# Patient Record
Sex: Male | Born: 1955 | Hispanic: No | Marital: Married | Smoking: Never smoker
Health system: Southern US, Community
[De-identification: ages and names within clinical notes are randomized; demographics above are authoritative.]

## PROBLEM LIST (undated history)

## (undated) DIAGNOSIS — G4730 Sleep apnea, unspecified: Secondary | ICD-10-CM

## (undated) DIAGNOSIS — R972 Elevated prostate specific antigen [PSA]: Secondary | ICD-10-CM

## (undated) DIAGNOSIS — Z1589 Genetic susceptibility to other disease: Secondary | ICD-10-CM

## (undated) DIAGNOSIS — G4700 Insomnia, unspecified: Secondary | ICD-10-CM

## (undated) DIAGNOSIS — E232 Diabetes insipidus: Secondary | ICD-10-CM

## (undated) DIAGNOSIS — E785 Hyperlipidemia, unspecified: Secondary | ICD-10-CM

## (undated) DIAGNOSIS — K219 Gastro-esophageal reflux disease without esophagitis: Secondary | ICD-10-CM

## (undated) DIAGNOSIS — R5383 Other fatigue: Secondary | ICD-10-CM

## (undated) DIAGNOSIS — J45909 Unspecified asthma, uncomplicated: Secondary | ICD-10-CM

## (undated) DIAGNOSIS — G930 Cerebral cysts: Secondary | ICD-10-CM

## (undated) DIAGNOSIS — K648 Other hemorrhoids: Secondary | ICD-10-CM

## (undated) DIAGNOSIS — Z860101 Personal history of adenomatous and serrated colon polyps: Secondary | ICD-10-CM

## (undated) DIAGNOSIS — M51369 Other intervertebral disc degeneration, lumbar region without mention of lumbar back pain or lower extremity pain: Secondary | ICD-10-CM

## (undated) DIAGNOSIS — K5190 Ulcerative colitis, unspecified, without complications: Secondary | ICD-10-CM

## (undated) DIAGNOSIS — J328 Other chronic sinusitis: Secondary | ICD-10-CM

## (undated) DIAGNOSIS — I4891 Unspecified atrial fibrillation: Secondary | ICD-10-CM

## (undated) DIAGNOSIS — I2510 Atherosclerotic heart disease of native coronary artery without angina pectoris: Secondary | ICD-10-CM

## (undated) DIAGNOSIS — E7800 Pure hypercholesterolemia, unspecified: Secondary | ICD-10-CM

## (undated) DIAGNOSIS — R0683 Snoring: Secondary | ICD-10-CM

## (undated) DIAGNOSIS — Z86010 Personal history of colonic polyps: Secondary | ICD-10-CM

## (undated) HISTORY — DX: Personal history of adenomatous and serrated colon polyps: Z86.0101

## (undated) HISTORY — DX: Sleep apnea, unspecified: G47.30

## (undated) HISTORY — DX: Ulcerative colitis, unspecified, without complications: K51.90

## (undated) HISTORY — DX: Unspecified asthma, uncomplicated: J45.909

## (undated) HISTORY — DX: Other hemorrhoids: K64.8

## (undated) HISTORY — DX: Hyperlipidemia, unspecified: E78.5

## (undated) HISTORY — DX: Unspecified atrial fibrillation: I48.91

## (undated) HISTORY — DX: Insomnia, unspecified: G47.00

## (undated) HISTORY — DX: Other fatigue: R53.83

## (undated) HISTORY — DX: Atherosclerotic heart disease of native coronary artery without angina pectoris: I25.10

## (undated) HISTORY — DX: Diabetes insipidus: E23.2

## (undated) HISTORY — DX: Other chronic sinusitis: J32.8

## (undated) HISTORY — DX: Elevated prostate specific antigen (PSA): R97.20

## (undated) HISTORY — PX: ATRIAL FIBRILLATION ABLATION: EP1191

## (undated) HISTORY — DX: Genetic susceptibility to other disease: Z15.89

## (undated) HISTORY — DX: Cerebral cysts: G93.0

## (undated) HISTORY — PX: COLONOSCOPY: SHX174

## (undated) HISTORY — DX: Snoring: R06.83

## (undated) HISTORY — DX: Gastro-esophageal reflux disease without esophagitis: K21.9

## (undated) HISTORY — DX: Other intervertebral disc degeneration, lumbar region without mention of lumbar back pain or lower extremity pain: M51.369

## (undated) HISTORY — DX: Personal history of colonic polyps: Z86.010

---

## 1976-12-15 HISTORY — PX: LUMBAR LAMINECTOMY: SHX95

## 2000-09-25 ENCOUNTER — Encounter (INDEPENDENT_AMBULATORY_CARE_PROVIDER_SITE_OTHER): Payer: Self-pay | Admitting: Specialist

## 2000-09-25 ENCOUNTER — Ambulatory Visit (HOSPITAL_COMMUNITY): Admission: RE | Admit: 2000-09-25 | Discharge: 2000-09-25 | Payer: Self-pay | Admitting: Gastroenterology

## 2007-12-16 HISTORY — PX: APPENDECTOMY: SHX54

## 2007-12-20 ENCOUNTER — Encounter (INDEPENDENT_AMBULATORY_CARE_PROVIDER_SITE_OTHER): Payer: Self-pay | Admitting: Surgery

## 2007-12-20 ENCOUNTER — Ambulatory Visit (HOSPITAL_COMMUNITY): Admission: EM | Admit: 2007-12-20 | Discharge: 2007-12-21 | Payer: Self-pay | Admitting: Emergency Medicine

## 2010-01-18 ENCOUNTER — Encounter: Admission: RE | Admit: 2010-01-18 | Discharge: 2010-01-18 | Payer: Self-pay | Admitting: Endocrinology

## 2011-04-29 NOTE — H&P (Signed)
NAMEDARYON, REMMERT NO.:  [id number]   MEDICAL RECORD NO.:  [id number]          PATIENT TYPE:  INP   LOCATION:  3031                         FACILITY:  [place]   PHYSICIAN:  Earnstine Regal, MD      DATE OF BIRTH:  [birth year]   DATE OF ADMISSION:  12/20/2007  DATE OF DISCHARGE:                              HISTORY & PHYSICAL   REFERRING PHYSICIANS:  Dr. Wilford Corner and Dr. Verneda Skill.   CHIEF COMPLAINT:  Abdominal pain, acute appendicitis.   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  The  patient woke this morning with abdominal discomfort.  This was  relatively generalized in the periumbilically region.  It was poorly  localized.  The patient experienced a few chills.  He may have had  minimal nausea.  He had no emesis.  Pain did not localized, but did  persist.  The patient was evaluated by Dr. Michail Sermon where from  gastroenterology.  He was referred to the emergency department at [facility] where he underwent CT scan of the abdomen and pelvis.  CT scan  results were reviewed and show findings consistent with acute  appendicitis.  There appears to be a dilated thickened appendix with  peri-appendiceal inflammatory changes.  There are 3 fecaliths present.  There is no sign of perforation.  Laboratory studies show a slightly  elevated white count of 10.9 with 82% segmented neutrophils.  General  surgery is now consulted for evaluation and management.   PAST MEDICAL HISTORY:  1. History of ulcerative colitis limited to the distal sigmoid and      rectum, followed by Dr. Earlie Raveling.  2. 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  3. Status-post vasectomy.   MEDICATIONS:  1. Lyalda.  2. Crestor.  3. Baby aspirin.   ALLERGIES:  NONE KNOWN.   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  He is  married.  He does not  smoke.   FAMILY HISTORY:  Noncontributory.   REVIEW OF SYSTEMS:  A 15 system review without significant other  findings, except as noted above.   PHYSICAL EXAMINATION:  GENERAL:  This is a 55 year old thin male on a  stretcher in the emergency department.  VITAL SIGNS:  Temperature 98.9, pulse 87, respirations 20, blood  pressure 136/84.  HEENT: Shows him to be normocephalic, atraumatic.  Sclerae clear.  Conjunctiva clear.  Pupils equal and reactive.  Dentition good.  Mucous  membranes moist.  Voice normal.  NECK:  Palpation of the neck shows no thyroid nodularity.  No  lymphadenopathy.  No tenderness.  LUNGS:  Clear to auscultation bilaterally.  CARDIAC:  Regular rate and rhythm without murmur.  Peripheral pulses are  full.  ABDOMEN:  Soft without distention.  There are a few hypoactive bowel  sounds on auscultation.  There is tenderness to palpation and percussion  in the right lower quadrant at approximately McBurney's point.  There is  mild  voluntary guarding.  There is no palpable mass.  There is no sign  of hernia.  There is no hepatosplenomegaly .  EXTREMITIES:  Nontender without edema.  NEUROLOGICALLY:  The patient is alert and oriented.  There is no sign of  tremor.   LABORATORY STUDIES:  White count 10.9, hemoglobin 14.0, hematocrit  40.5%, platelet count 220,000.  Differential shows 82% neutrophils, 12%  lymphocytes, 4% monocytes.  Electrolytes are normal.  Renal function is  normal.  Liver function tests are normal with the exception of total  bilirubin of 2.1.  Lipase was normal at 22.   RADIOGRAPHIC STUDIES:  CT scan abdomen and pelvis with findings as noted  above consistent with acute appendicitis.   IMPRESSION:  Acute appendicitis.   PLAN:  The patient will be admitted on the general surgical service at  [facility].  The patient will be started on intravenous  antibiotics with Unasyn. Operating room has been notified and the  patient will be  prepared for laparoscopic appendectomy tonight.  Risk  and benefits of the procedure are discussed.  The patient understands  there is approximately a 90% chance of success by laparoscopic technique  and approximately a 10% chance of conversion to open surgery.  Hospital  course to be expected is reviewed.  The patient understands and agrees  to proceed.      Earnstine Regal, MD  Electronically Signed     TMG/MEDQ  D:  12/20/2007  T:  12/21/2007  Job:  [id number]   cc:   Earnstine Regal, MD  Elayne Snare, M.D.  Mayme Genta, M.D.  Lear Ng, MD

## 2011-04-29 NOTE — Op Note (Signed)
NAMEBRAYLIN, [name] NO.:  [id number]   MEDICAL RECORD NO.:  [id number]          PATIENT TYPE:  INP   LOCATION:  3031                         FACILITY:  [place]   PHYSICIAN:  Earnstine Regal, MD      DATE OF BIRTH:  [birth year]   DATE OF PROCEDURE:  12/20/2007  DATE OF DISCHARGE:                               OPERATIVE REPORT   PREOPERATIVE DIAGNOSIS:  Acute appendicitis.   POSTOPERATIVE DIAGNOSIS:  Acute appendicitis.   PROCEDURE:  Laparoscopic appendectomy.   SURGEON:  Earnstine Regal, MD, FACS   ANESTHESIA:  General per Dr. Leda Quail   ESTIMATED BLOOD LOSS:  Minimal.   PREPARATION:  Betadine.   COMPLICATIONS:  None.   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  The  patient was seen and evaluated by his gastroenterologist and underwent  CT scan of the abdomen and pelvis.  CT scan demonstrated findings  consistent with acute appendicitis.  Laboratory work showed an elevated  white blood cell count of 10.9 with 82% segmented neutrophils.  Physical  examination was consistent with acute appendicitis.  The patient is now  prepared and brought to the operating room.   DESCRIPTION OF PROCEDURE:  Procedure was done in OR #16 at the [place]  [facility].  The patient is brought to the operating room,  placed in supine position on the operating room table.  Following  administration of general anesthesia, the patient is prepped and draped  in the usual strict aseptic fashion.  After ascertaining that an  adequate level of anesthesia had been achieved, an infraumbilical  incision was made a #15 blade.  Dissection was carried down through the  subcutaneous tissues.  Fascia was incised in the midline, and the  peritoneal cavity was entered cautiously.  A 0 Vicryl pursestring suture  was placed in the fascia.  A Hasson cannula was introduced under direct  vision and secured with a pursestring suture.  Abdomen was insufflated  with carbon dioxide.  Laparoscope was inserted and the abdomen explored.  Liver and gallbladder appeared normal.  Small bowel appears normal.  In  the right lower quadrant, there is a tense, distended, indurated  appearing appendix.  There is no sign of perforation.   Operative ports were placed in the right upper quadrant and left lower  quadrant.  Using the harmonic scalpel, the mesoappendix was taken down.  Lateral peritoneal attachments of the cecum were taken down allowing for  mobility of the cecum.  Dissection was carried down to the base of the  appendix with good hemostasis achieved with the harmonic scalpel.  Appendix is transected at the junction of the base of the appendix and  the cecal wall with an Endo-GIA stapler.  Appendix was placed into an  EndoCatch bag and withdrawn through the umbilical port without  difficulty.   Right lower quadrant was irrigated with warm saline which was evacuated.  Good hemostasis was noted.  Staple line appears intact.  No residual  appendix is visible.  Omentum is used to cover the surgical site.  Pneumoperitoneum was released, and ports were removed under direct  vision.  Good hemostasis was noted at all three port sites.  Zero Vicryl  pursestring suture was tied securely.  Port sites were anesthetized with  local anesthetic.  Wounds were closed with interrupted 4-0 Monocryl  subcuticular sutures.  Wounds were washed and dried, and Benzoin and  Steri-Strips were applied.  Sterile dressings were applied.  The patient  was awakened from anesthesia and brought to the recovery room in stable  condition.  The patient tolerated the procedure well.      Earnstine Regal, MD  Electronically Signed     TMG/MEDQ  D:  12/20/2007  T:  12/21/2007  Job:  [id number]   cc:   Elayne Snare, M.D.  Mayme Genta, M.D.  Lear Ng, MD  Earnstine Regal, MD

## 2011-04-29 NOTE — Consult Note (Signed)
[name], [name] NO.:  [id number]   MEDICAL RECORD NO.:  [id number]          PATIENT TYPE:  INP   LOCATION:  3031                         FACILITY:  [place]   PHYSICIAN:  Lear Ng, MDDATE OF BIRTH:  [birth year]   DATE OF CONSULTATION:  12/20/2007  DATE OF DISCHARGE:                                 CONSULTATION   REASON FOR CONSULTATION:  Abdominal pain, history of ulcerative colitis.   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  He says the pain is waxing and waning, but he feels  more of it on the left upper quadrant earlier this afternoon.  At times,  the pain is very intense and does have associated belching.  He denies  any associated nausea or vomiting.  He has a history rectosigmoid  ulcerative colitis that has been treated with Lialda 2.4 grams per day  for 1 year.  Previous to that, he was on Asacol and Canasa.  His last  colonoscopy was in November 2007 which showed inflammation up to 25 cm  on endoscopic visualization.  Biopsies were consistent with proctitis  although sigmoid biopsies were negative.  He called Dr. Liliane Channel office  this morning, and Dr. Earlean Shawl was [place] the [place] at the  time but a dose of hyoscyamine was given without any relief.  He was set  up to see Dr. Cristina Gong in the office this afternoon, but he felt like the  pain was too bad, and therefore, the plan was changed to have him come  to the emergency room.  Abdominal x-ray was negative for dilated loops  and showed no air fluid levels.  No obstruction was visualized on the  abdominal x-ray.  White blood count was 10.9 with hemoglobin of 14 and  platelets of 220,000.  His liver function tests, amylase, and lipase are  all pending.   PAST MEDICAL HISTORY:  1. Ulcerative  colitis (rectosigmoid distribution).  2. Hyperlipidemia.   MEDICATIONS:  Lialda 2.4 g per day.   ALLERGIES:  No known drug allergies.   REVIEW OF SYSTEMS:  Negative except as stated above.   PHYSICAL EXAMINATION:  Temperature 98.9, pulse 87, blood pressure  136/84, O2 saturations 100% on room air.  GENERAL:  Alert, mild acute distress.  ABDOMEN:  Epigastric tenderness with guarding, right lower quadrant  tenderness without guarding, otherwise nontender, soft, nondistended.  CARDIOVASCULAR:  Regular rate and rhythm.  LUNGS:  Clear to auscultation bilaterally.   IMPRESSION:  A 55 year old patient with known distal ulcerative colitis  presenting with 12 hours of sharp, intermittent abdominal pain.  Differential diagnosis includes pancreatitis versus ulcerative colitis  flare versus appendicitis.  I doubt this is appendicitis based on his  presentation.  The main concern would be for pancreatitis more so than  ulcerative colitis flare.  Will get abdominal and pelvic CT scan with  contrast to further evaluate  his anatomy.  Will keep the patient on IV  fluids, pain medicines and antiemetics as needed.  The patient wants to  go  home if his labs and scan look okay.  Will follow-up with Dr. Earlean Shawl in  the morning.  I think this is a reasonable plan if his workup is  negative.  If there is any concern on CAT scan and/or lab, then may need  to keep the patient overnight for observation.  Further recommendations  will occur pending his CAT scan.      Lear Ng, MD  Electronically Signed     VCS/MEDQ  D:  12/20/2007  T:  12/21/2007  Job:  [id number]   cc:   Mayme Genta, M.D.

## 2011-05-02 NOTE — Op Note (Signed)
[facility]  Patient:    [name], [name]                        MRN: [id number] Proc. Date: 09/25/00 Adm. Date:  [id number] Disc. Date: [id number] Attending:  Harriette Bouillon CC:         Elayne Snare, M.D.   Operative Report  PROCEDURE:  Colonoscopic polypectomy, biopsy.  ENDOSCOPIST:  Mayme Genta, M.D.  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  Intermittent flares were characterized by increased urgency, small volume stool, minimal hematochezia on Canasa 500 mg suppository b.i.d. with adequate relief.  He was last treated with rapid steroid taper 7/01.  PROCEDURE:  After reviewing the nature of the procedure with the patient including potential risks and complications including potential hemorrhage and perforation, informed consent was signed.  The patient was premedicated receiving IV sedation totalling Versed 8.5 mg, Fentanyl 100 mcg administered in divided doses prior to and during the procedure.  Using an Olympus pediatric PCF - 140 L video colonoscope, the rectum was intubated after normal digital examination which revealed no evidence of perianal or intrarectal pathology.  The prostate is small, smooth and bilaterally symmetric without nodularity.  The scope was inserted and advanced without difficulty around the entire length of the colon to the cecum identified by the appendiceal orifice and ileocecal valve. Preparation was excellent throughout.  The terminal ileum was intubated over the distal 10-15 cm.  It appeared entirely normal.  Photo documentation was obtained and biopsy was obtained.  The scope was withdrawn into the cecum.  Additional random biopsies were obtained to rule out microscopic inflammation.  No evidence endoscopically of extension of the  inflammatory bowel disease process to this point.  The scope was slowly withdrawn with careful inspection throughout the entire colon.  No biopsies were obtained from the right transverse or descending colon as these appeared entirely normal.  Beginning at 40 cm, there was an abrupt transition zone.  The mucosa appeared chronically inflamed caudad to this level characterized by edema, erythema, patchy heme with increased friability.  Biopsies were obtained from the sigmoid and rectum.  A solitary polyp approximately 6 mm in diameter was identified at 20 cm.  This was resected using electrocautery snare, recovered and submitted to pathology.  Attempts to retroflex the scope in the rectal vaults were unsuccessful due to the small caliber of the vault and the patients discomfort.  The colon was decompressed and the scope was withdrawn.  The patient tolerated the procedure without difficulty being maintained on monitor and no flow oxygen throughout.  TIME:  2.  TECHNICAL:  3.  PREPARATION:  1.  TOTAL SCORE:  6.  ASSESSMENT: 1. Chronic ulcerative colitis limited to the rectosigmoid to 40 cm    biopsy obtained.  Additional biopsy obtained from the uninvolved    appearing mucosa in the cecum and terminal ileum. 2. Rectal polyp-sessile benign, resected.  Pathology pending.  RECOMMENDATIONS: 1. Post polypectomy instructions reviewed. 2. Follow-up pathology. 3. Repeat colonoscopy in three years if adenoma.  If hyperplastic    follow-up colonoscopy for surveillance will be predicated on the    course of the patients inflammatory bowel disease. 4. Add Asacol 400 mg two tabs p.o. t.i.d. to treat colonoscopically    evident mucosal inflammation. DD:  09/25/00 TD:  09/27/00 Job: [id number]

## 2011-09-04 LAB — COMPREHENSIVE METABOLIC PANEL
ALT: 21
BUN: 11
CO2: 26
Calcium: 8.9
Chloride: 104
Glucose, Bld: 110 — ABNORMAL HIGH
Potassium: 3.5
Total Bilirubin: 2.1 — ABNORMAL HIGH
Total Protein: 7.6

## 2012-01-25 ENCOUNTER — Other Ambulatory Visit: Payer: Self-pay

## 2012-01-25 ENCOUNTER — Emergency Department (HOSPITAL_COMMUNITY)
Admission: EM | Admit: 2012-01-25 | Discharge: 2012-01-25 | Disposition: A | Discharge status: Home / Self Care | Payer: BC Managed Care – PPO | Attending: Emergency Medicine | Admitting: Emergency Medicine

## 2012-01-25 ENCOUNTER — Emergency Department (HOSPITAL_COMMUNITY): Payer: BC Managed Care – PPO

## 2012-01-25 ENCOUNTER — Encounter (HOSPITAL_COMMUNITY): Payer: Self-pay | Admitting: Emergency Medicine

## 2012-01-25 DIAGNOSIS — E7800 Pure hypercholesterolemia, unspecified: Secondary | ICD-10-CM | POA: Insufficient documentation

## 2012-01-25 DIAGNOSIS — Z79899 Other long term (current) drug therapy: Secondary | ICD-10-CM | POA: Insufficient documentation

## 2012-01-25 DIAGNOSIS — K219 Gastro-esophageal reflux disease without esophagitis: Secondary | ICD-10-CM

## 2012-01-25 DIAGNOSIS — R079 Chest pain, unspecified: Secondary | ICD-10-CM | POA: Insufficient documentation

## 2012-01-25 DIAGNOSIS — K5150 Left sided colitis without complications: Secondary | ICD-10-CM | POA: Insufficient documentation

## 2012-01-25 DIAGNOSIS — R1013 Epigastric pain: Secondary | ICD-10-CM | POA: Insufficient documentation

## 2012-01-25 DIAGNOSIS — K529 Noninfective gastroenteritis and colitis, unspecified: Secondary | ICD-10-CM | POA: Insufficient documentation

## 2012-01-25 HISTORY — DX: Pure hypercholesterolemia, unspecified: E78.00

## 2012-01-25 LAB — HEPATIC FUNCTION PANEL
Albumin: 4 g/dL (ref 3.5–5.2)
Alkaline Phosphatase: 66 U/L (ref 39–117)
Bilirubin, Direct: 0.2 mg/dL (ref 0.0–0.3)
Total Bilirubin: 1.2 mg/dL (ref 0.3–1.2)

## 2012-01-25 LAB — BASIC METABOLIC PANEL
BUN: 19 mg/dL (ref 6–23)
CO2: 28 mEq/L (ref 19–32)
Chloride: 102 mEq/L (ref 96–112)
GFR calc non Af Amer: >90 mL/min (ref >90)
Glucose, Bld: 122 mg/dL — ABNORMAL HIGH (ref 70–99)
Sodium: 137 mEq/L (ref 135–145)

## 2012-01-25 LAB — CBC: MCV: 88.7 fL (ref 78.0–100.0)

## 2012-01-25 LAB — LIPASE, BLOOD: Lipase: 50 U/L (ref 11–59)

## 2012-01-25 LAB — POCT I-STAT TROPONIN I

## 2012-01-25 MED ORDER — ASPIRIN 81 MG PO CHEW
324.0000 mg | CHEWABLE_TABLET | Freq: Once | ORAL | Status: AC
  Administered 2012-01-25: 324 mg via ORAL
  Filled 2012-01-25: qty 4

## 2012-01-25 MED ORDER — GI COCKTAIL ~~LOC~~
30.0000 mL | Freq: Once | ORAL | Status: AC
  Administered 2012-01-25: 30 mL via ORAL
  Filled 2012-01-25: qty 30

## 2012-01-25 MED ORDER — PANTOPRAZOLE SODIUM 40 MG PO TBEC: 40.0000 mg | DELAYED_RELEASE_TABLET | Freq: Every day | ORAL | Status: DC

## 2012-01-25 NOTE — ED Notes (Signed)
Lab at bedside

## 2012-01-25 NOTE — ED Provider Notes (Addendum)
History     CSN: [id number]  Arrival date & time 01/25/12  0547   First MD Initiated Contact with Patient 01/25/12 [id number]      Chief Complaint  Patient presents with  . Chest Pain    (Consider location/radiation/quality/duration/timing/severity/associated sxs/prior treatment) Patient is a 56 y.o. male presenting with chest pain. The history is provided by the patient.  Chest Pain    patient presents with epigastric pain which awoke him from sleep. Described as an uneasyness  Nonradiating. No associated shortness of breath, diaphoresis, vomiting. No prior history of same. Patient did use antacids and drinking ginger ale which he thinks may have made her symptoms better. Total duration of her symptoms was possibly 5 minutes he is currently asymptomatic. Denies any recent fever, cough, leg pain or travel history. Denies any syncope. Patient had a stress test 5 years ago which according to him was negative. He does have significant cardiac risk factors including high cholesterol and family history  Past Medical History  Diagnosis Date  . Hypercholesteremia   . Ulcerative colitis     History reviewed. No pertinent past surgical history.  History reviewed. No pertinent family history.  History  Substance Use Topics  . Smoking status: Never Smoker   . Smokeless tobacco: Not on file  . Alcohol Use: No      Review of Systems  Cardiovascular: Positive for chest pain.  All other systems reviewed and are negative.    Allergies  Review of patient's allergies indicates no known allergies.  Home Medications   Current Outpatient Rx  Name Route Sig Dispense Refill  . FEXOFENADINE HCL 180 MG PO TABS Oral Take 90 mg by mouth daily. 1/2 tablet for allergies    . MESALAMINE 1.2 G PO TBEC Oral Take 1,200 mg by mouth 2 (two) times daily.    Marland Kitchen ROSUVASTATIN CALCIUM 5 MG PO TABS Oral Take 5 mg by mouth daily.      BP 131/78  Pulse 77  Temp(Src) 97.9 F (36.6 C) (Oral)  Resp 15  Ht  5' 9"  (1.753 m)  Wt 155 lb (70.308 kg)  BMI 22.89 kg/m2  SpO2 100%  Physical Exam  Nursing note and vitals reviewed. Constitutional: He is oriented to person, place, and time. Vital signs are normal. He appears well-developed and well-nourished.  Non-toxic appearance. No distress.  HENT:  Head: Normocephalic and atraumatic.  Eyes: Conjunctivae, EOM and lids are normal. Pupils are equal, round, and reactive to light.  Neck: Normal range of motion. Neck supple. No tracheal deviation present. No mass present.  Cardiovascular: Normal rate, regular rhythm and normal heart sounds.  Exam reveals no gallop.   No murmur heard. Pulmonary/Chest: Effort normal and breath sounds normal. No stridor. No respiratory distress. He has no decreased breath sounds. He has no wheezes. He has no rhonchi. He has no rales.  Abdominal: Soft. Normal appearance and bowel sounds are normal. He exhibits no distension. There is no tenderness. There is no rebound and no CVA tenderness.  Musculoskeletal: Normal range of motion. He exhibits no edema and no tenderness.  Neurological: He is alert and oriented to person, place, and time. He has normal strength. No cranial nerve deficit or sensory deficit. GCS eye subscore is 4. GCS verbal subscore is 5. GCS motor subscore is 6.  Skin: Skin is warm and dry. No abrasion and no rash noted.  Psychiatric: He has a normal mood and affect. His speech is normal and behavior is normal.  ED Course  Procedures (including critical care time)   Labs Reviewed  CBC  BASIC METABOLIC PANEL  LIPASE, BLOOD  HEPATIC FUNCTION PANEL   No results found.   No diagnosis found.    MDM   Date: 01/25/2012  Rate: 81  Rhythm: normal sinus rhythm  QRS Axis: normal  Intervals: normal  ST/T Wave abnormalities: normal  Conduction Disutrbances:none  Narrative Interpretation:   Old EKG Reviewed: none available  Spoke with dr. Nadyne Coombes, will come to ed to see pt   7:14 AM Patient seen  by his cardiologist and has been cleared for discharge. Patient was given aspirin and began having worsening pain which is relieved with a GI cocktail suspect patient has GERD       Leota Jacobsen, MD 01/25/12 [id number]  Leota Jacobsen, MD 01/25/12 [phone]

## 2012-01-25 NOTE — ED Notes (Signed)
Patient complaining of left sided chest pain that started an hour ago.  Patient states that he was lying in bed when the chest pain started; pain radiated to his epigastric area.  Patient currently rates pain 2/10 currently; describes chest pain as "heaviness".  Patient denies nausea, vomiting, diaphoresis, and shortness of breath.  Denies previous cardiac history.

## 2012-01-25 NOTE — ED Notes (Signed)
MD at bedside. 

## 2012-01-25 NOTE — Consult Note (Signed)
CARDIOLOGY CONSULT NOTE  Patient ID: [name] MRN: [id number] DOB/AGE: March 14, 1956 56 y.o.  Admit date: 01/25/2012 Referring Physician Vivi Martens, MD Primary Physician: Provider Not In System Reason for Consultation Chest pain.  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 Last night he had pizza for dinner and it was spicy. Workup around 4:30 a.m. this morning woke up with his comfort and uneasy feeling in his chest associated with mild dizziness. He also felt some palpitations. His blood pressure was normal at home but because of palpitations and chest discomfort he took some TUMS and eventually came to the emergency department. Presently was feeling well by the time he came to the ED but after aspirin was good to him felt discomfort come back again. But otherwise is feeling well. He received GI cocktail with complete resolution of chest discomfort. Denies any shortness of breath, syncope, hemoptysis, no nausea vomiting.  Past Medical History  Diagnosis Date  . Hypercholesteremia   . Ulcerative colitis      History reviewed. No pertinent past surgical history.   History reviewed. No pertinent family history.  Social History: History   Social History  . Marital Status: Married    Spouse Name: N/A    Number of Children: N/A  . Years of Education: N/A   Occupational History  . Not on file.   Social History Main Topics  . Smoking status: Never Smoker   . Smokeless tobacco: Never Used  . Alcohol Use: No  . Drug Use: No  . Sexually Active: Not on file   Other Topics Concern  . Not on file   Social History Narrative  . No narrative on file      ROS: General: no fevers/chills/night sweats Eyes: no blurry vision, diplopia, or amaurosis ENT: no sore throat or hearing loss Resp: no cough, wheezing, or hemoptysis CV: no edema, PND or orthopnea. GI: no abdominal pain, nausea, vomiting,  diarrhea, or constipation GU: no dysuria, frequency, or hematuria Skin: no rash Neuro: no headache, numbness, tingling, or weakness of extremities Musculoskeletal: no joint pain or swelling Heme: no bleeding, DVT, or easy bruising Endo: no polydipsia or polyuria    Physical Exam: Blood pressure 131/78, pulse 77, temperature 97.9 F (36.6 C), temperature source Oral, resp. rate 15, height 5' 9"  (1.753 m), weight 70.308 kg (155 lb), SpO2 100.00%.  Pt is alert and oriented, WD, WN, in no distress. HEENT: normal Neck: JVP normal. Carotid upstrokes normal without bruits. No thyromegaly. Lungs: equal expansion, clear bilaterally CV: Apex is discrete and nondisplaced, RRR without murmur or gallop Abd: soft, NT, +BS, no bruit, no hepatosplenomegaly Back: no CVA tenderness Ext: no C/C/E        Femoral pulses 2+= without bruits        DP/PT pulses intact and = Skin: warm and dry without rash Neuro: CNII-XII intact             Strength intact = bilaterally  Labs:   Lab Results  Component Value Date   WBC 7.0 01/25/2012   HGB 15.2 01/25/2012   HCT 44.1 01/25/2012   MCV 88.7 01/25/2012   PLT 199 01/25/2012    Lab 01/25/12 0554  NA 137  K 3.5  CL 102  CO2 28  BUN 19  CREATININE 0.92  CALCIUM 10.2  PROT --  BILITOT --  ALKPHOS --  ALT --  AST --  GLUCOSE 122*  Radiology: Dg Chest 2 View  01/25/2012  *RADIOLOGY REPORT*  Clinical Data: Chest pain.  CHEST - 2 VIEW  Comparison: 01/18/2010  Findings: Hyperinflation. The heart size and pulmonary vascularity are normal. The lungs appear clear and expanded without focal air space disease or consolidation. No blunting of the costophrenic angles.  Mild degenerative change in the thoracic spine.  No pneumothorax.  No significant change since previous study.  IMPRESSION: No evidence of active pulmonary disease.  Original Report Authenticated By: Neale Burly, M.D.    EKG:NSR @ 81/min. Normal intervals. No ischemia  ASSESSMENT AND  PLAN:  Chest pain due to GERD.  Hyperlipidemia controlled. Elevated blood sugar, but patient is non fasting and also had Tums and ginger ale just prior to coming to the ED.  Rec: OK to discharge patient home with op follow-up. Wife present at the bedside. Patient has my contacts and advised to contact me if further problems.  Laverda Page, MD 01/25/2012, 7:14 AM

## 2012-01-25 NOTE — ED Notes (Signed)
Patient transported to X-ray 

## 2015-12-27 MED FILL — ROSUVASTATIN CALCIUM 5 MG T: 5 | 90 days supply | Qty: 90 | Fill #1

## 2015-12-27 MED FILL — LIALDA 1.2 GM TABLET SA: 1.2 | 90 days supply | Qty: 180 | Fill #0

## 2016-01-29 DIAGNOSIS — H6121 Impacted cerumen, right ear: Secondary | ICD-10-CM | POA: Diagnosis not present

## 2016-05-13 MED FILL — LIALDA 1.2 GM TABLET SA: 1.2 | 30 days supply | Qty: 60 | Fill #1

## 2016-05-13 MED FILL — ROSUVASTATIN CALCIUM 5 MG T: 5 | 90 days supply | Qty: 90 | Fill #2

## 2016-06-03 MED FILL — AZITHROMYCIN 250 MG TABLET: 250 | 5 days supply | Qty: 6 | Fill #0

## 2016-08-12 MED FILL — ROSUVASTATIN CALCIUM 5 MG T: 5 | 90 days supply | Qty: 90 | Fill #3

## 2016-08-12 MED FILL — LIALDA 1.2 GM TABLET SA: 1.2 | 30 days supply | Qty: 60 | Fill #2

## 2016-10-28 DIAGNOSIS — H43822 Vitreomacular adhesion, left eye: Secondary | ICD-10-CM | POA: Diagnosis not present

## 2016-10-28 DIAGNOSIS — H2513 Age-related nuclear cataract, bilateral: Secondary | ICD-10-CM | POA: Diagnosis not present

## 2016-11-10 MED FILL — ROSUVASTATIN CALCIUM 5 MG T: 5 | 90 days supply | Qty: 90 | Fill #0

## 2016-11-11 DIAGNOSIS — H9042 Sensorineural hearing loss, unilateral, left ear, with unrestricted hearing on the contralateral side: Secondary | ICD-10-CM | POA: Diagnosis not present

## 2016-11-11 DIAGNOSIS — H6122 Impacted cerumen, left ear: Secondary | ICD-10-CM | POA: Diagnosis not present

## 2016-12-04 DIAGNOSIS — E784 Other hyperlipidemia: Secondary | ICD-10-CM | POA: Diagnosis not present

## 2016-12-04 DIAGNOSIS — Z0000 Encounter for general adult medical examination without abnormal findings: Secondary | ICD-10-CM | POA: Diagnosis not present

## 2017-01-30 DIAGNOSIS — E784 Other hyperlipidemia: Secondary | ICD-10-CM | POA: Diagnosis not present

## 2017-01-30 DIAGNOSIS — J309 Allergic rhinitis, unspecified: Secondary | ICD-10-CM | POA: Diagnosis not present

## 2017-01-30 DIAGNOSIS — Z1389 Encounter for screening for other disorder: Secondary | ICD-10-CM | POA: Diagnosis not present

## 2017-01-30 DIAGNOSIS — K5190 Ulcerative colitis, unspecified, without complications: Secondary | ICD-10-CM | POA: Diagnosis not present

## 2017-01-30 DIAGNOSIS — Z0000 Encounter for general adult medical examination without abnormal findings: Secondary | ICD-10-CM | POA: Diagnosis not present

## 2017-02-06 DIAGNOSIS — Z1212 Encounter for screening for malignant neoplasm of rectum: Secondary | ICD-10-CM | POA: Diagnosis not present

## 2017-03-31 MED FILL — NYSTATIN 100,000 UNIT/GM CR: 100000 | 15 days supply | Qty: 30 | Fill #0

## 2017-11-24 DIAGNOSIS — K51919 Ulcerative colitis, unspecified with unspecified complications: Secondary | ICD-10-CM | POA: Diagnosis not present

## 2017-11-24 DIAGNOSIS — K648 Other hemorrhoids: Secondary | ICD-10-CM | POA: Diagnosis not present

## 2017-11-24 DIAGNOSIS — K5190 Ulcerative colitis, unspecified, without complications: Secondary | ICD-10-CM | POA: Diagnosis not present

## 2017-11-24 DIAGNOSIS — D126 Benign neoplasm of colon, unspecified: Secondary | ICD-10-CM | POA: Diagnosis not present

## 2017-11-24 DIAGNOSIS — Z1211 Encounter for screening for malignant neoplasm of colon: Secondary | ICD-10-CM | POA: Diagnosis not present

## 2017-11-24 DIAGNOSIS — K5130 Ulcerative (chronic) rectosigmoiditis without complications: Secondary | ICD-10-CM | POA: Diagnosis not present

## 2018-04-30 DIAGNOSIS — E785 Hyperlipidemia, unspecified: Secondary | ICD-10-CM | POA: Diagnosis not present

## 2018-04-30 DIAGNOSIS — Z136 Encounter for screening for cardiovascular disorders: Secondary | ICD-10-CM | POA: Diagnosis not present

## 2018-04-30 DIAGNOSIS — R0609 Other forms of dyspnea: Secondary | ICD-10-CM | POA: Diagnosis not present

## 2018-04-30 DIAGNOSIS — E782 Mixed hyperlipidemia: Secondary | ICD-10-CM | POA: Diagnosis not present

## 2018-04-30 DIAGNOSIS — Z0189 Encounter for other specified special examinations: Secondary | ICD-10-CM | POA: Diagnosis not present

## 2018-05-03 DIAGNOSIS — R0609 Other forms of dyspnea: Secondary | ICD-10-CM | POA: Diagnosis not present

## 2018-05-04 DIAGNOSIS — R0602 Shortness of breath: Secondary | ICD-10-CM | POA: Diagnosis not present

## 2018-05-04 DIAGNOSIS — R9431 Abnormal electrocardiogram [ECG] [EKG]: Secondary | ICD-10-CM | POA: Diagnosis not present

## 2018-05-05 DIAGNOSIS — R9439 Abnormal result of other cardiovascular function study: Secondary | ICD-10-CM | POA: Diagnosis present

## 2018-05-05 DIAGNOSIS — R0602 Shortness of breath: Secondary | ICD-10-CM | POA: Diagnosis present

## 2018-05-05 NOTE — H&P (Signed)
OFFICE VISIT NOTES COPIED TO EPIC FOR DOCUMENTATION  . History of Present Illness Corey Page MD; 05/01/2018 3:58 PM) The patient is a 62 year old male who presents for shortness of breath. 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 He has not had any chest pain, palpitations, dizziness or syncope. Otherwise remains asymptomatic.   Problem List/Past Medical Corey Page, MD; 2018/05/12 3:09 PM) Hyperlipidemia, mild (E78.5)  Exertional dyspnea (R06.09)  Exercise Treadmill Stress Test 04/30/2018: Indication: Exertional dyspnea The patient exercised on Bruce protocol for 146 min. Patient achieved 10.16 METS and reached HR 146 bpm, which is 92 % of maximum age-predicted HR. Stress test terminated due to fatigue.  Exercise capacity was normal.  HR Response to Exercise: Appropriate. BP Response to Exercise: Normal resting BP- appropriate response. Chest Pain: None. Patient had limiting shortness of breath. Arrhythmias: Occasional PVC and PACs. ST Changes: With peak exercise, there were 2-3 mm downsloping ST depressions in leads II, III, aVF, and 2 mm upsloping StTdepressions in leads V4-V6. There is <56m ST elevation in lead aVR. ST changes persist 2 min into recovery.  Overall Impression: Positive stress test suggestive of ischemia. ST Changes: With peak exercise there was no ST-T changes of ischemia. Consider further cardiac evaluation including cardiac catheterization if clinically indicated.  Allergies (Corey Page MD; [id number]:10 PM) No Known Drug Allergies [005/29/2019:  Family History (Corey Page MD; 505/29/193:10 PM) Mother  Deceased. at age 64 no heart issues Father  Deceased. at age 64 MI (first one) Brother 2  In good health. 1 older; 1 younger; no heart  issues  Social History (Corey Page MD; 52019/05/293:10 PM) Current tobacco use  Never smoker. Non Drinker/No Alcohol Use  Marital status  Married. Living Situation  Lives with spouse. Number of Children  2.  Past Surgical History (Corey Page MD; 5May 29, 20193:10 PM) Appendectomy [2011]: Lumpectomy [[5784]  Medication History (Corey Page MD; 5May 29, 20193:10 PM) Rosuvastatin Calcium (10MG Tablet, 1 Oral daily) Active. Metoprolol Succinate ER (25MG Tablet ER 24HR, 1 Tablet Oral twice a day, Taken starting 05/01/2018) Active. Aspirin (81MG Tablet Chewable, 1 (one) Tablet Oral daily, Taken starting 05/01/2018) Active. Lialda (1.2GM Tablet DR, 1 Oral daily) Active. Medications Reconciled  Diagnostic Studies History (Corey Page MD; 05/04/2018 9:48 PM) Echocardiogram  Echocardiogram 05/04/2018: 1. Left ventricle cavity is normal in size. Normal left ventricular shape. Normal global wall motion. Normal diastolic filling pattern. Calculated EF 96%. 2. Trileaflet aortic valve with mild (Grade I) regurgitation. 3. Mild mitral valve leaflet thickening. Borderline prolapse of the mitral valve leaflets. Mild (Grade I) mitral regurgitation. 4. Mild tricuspid regurgitation. No evidence of pulmonary hypertension. 5. Occasional PVC noted during entire study. Treadmill stress test  Exercise Treadmill Stress Test 04/30/2018: Indication: Exertional dyspnea The patient exercised on Bruce protocol for 146 min. Patient achieved 10.16 METS and reached HR 146 bpm, which is 92 % of maximum age-predicted HR. Stress test terminated due to fatigue.  Exercise capacity was normal.  HR Response to Exercise: Appropriate. BP Response to Exercise: Normal resting BP- appropriate response. Chest Pain: None. Patient had limiting shortness of breath. Arrhythmias: Occasional PVC and PACs. ST Changes: With peak exercise, there were 2-3 mm downsloping ST depressions in leads II,  III, aVF, and 2 mm upsloping StTdepressions in  leads V4-V6. There is <68m ST elevation in lead aVR. ST changes persist 2 min into recovery.  Overall Impression: Positive stress test suggestive of ischemia. ST Changes: With peak exercise there was no ST-T changes of ischemia. Consider further cardiac evaluation including cardiac catheterization if clinically indicated.    Review of Systems (Corey PageMD; 05/01/2018 3:47 PM) General Not Present- Appetite Loss and Weight Gain. Respiratory Present- Decreased Exercise Tolerance. Not Present- Chronic Cough and Wakes up from Sleep Wheezing or Short of Breath. Gastrointestinal Not Present- Black, Tarry Stool and Difficulty Swallowing. Musculoskeletal Not Present- Decreased Range of Motion and Muscle Atrophy. Neurological Not Present- Attention Deficit. Psychiatric Not Present- Personality Changes and Suicidal Ideation. Endocrine Not Present- Cold Intolerance and Heat Intolerance. Hematology Not Present- Abnormal Bleeding. All other systems negative  Vitals (Corey PageMD; 05/01/2018 3:47 PM) 04/30/2018 4:45 PM Weight: 145 lb Height: 69in Body Surface Area: 1.8 m Body Mass Index: 21.41 kg/m  Pulse: 78 (Regular)  BP: 120/68 (Sitting, Left Arm, Standard)       Physical Exam (Corey PageMD; 05/01/2018 3:47 PM) General Mental Status-Alert. General Appearance-Cooperative and Appears stated age. Build & Nutrition-Well built and Well nourished.  Head and Neck Thyroid Gland Characteristics - normal size and consistency and no palpable nodules.  Chest and Lung Exam Chest and lung exam reveals -quiet, even and easy respiratory effort with no use of accessory muscles, non-tender and on auscultation, normal breath sounds, no adventitious sounds.  Cardiovascular Cardiovascular examination reveals -normal heart sounds, regular rate and rhythm with no murmurs, carotid auscultation reveals no bruits,  abdominal aorta auscultation reveals no bruits and no prominent pulsation, femoral artery auscultation bilaterally reveals normal pulses, no bruits, no thrills, normal pedal pulses bilaterally and no digital clubbing, cyanosis, edema, increased warmth or tenderness.  Abdomen Palpation/Percussion Palpation and Percussion of the abdomen reveal - Non Tender and No hepatosplenomegaly.  Neurologic Neurologic evaluation reveals -alert and oriented x 3 with no impairment of recent or remote memory. Motor-Grossly intact without any focal deficits.  Musculoskeletal Global Assessment Left Lower Extremity - no deformities, masses or tenderness, no known fractures. Right Lower Extremity - no deformities, masses or tenderness, no known fractures.    Assessment & Plan (Corey PageMD; 05/04/2018 9:47 PM) Exertional dyspnea (R06.09) Story: Exercise Treadmill Stress Test 04/30/2018: Indication: Exertional dyspnea The patient exercised on Bruce protocol for 146 min. Patient achieved 10.16 METS and reached HR 146 bpm, which is 92 % of maximum age-predicted HR. Stress test terminated due to fatigue.  Exercise capacity was normal.  HR Response to Exercise: Appropriate. BP Response to Exercise: Normal resting BP- appropriate response. Chest Pain: None. Patient had limiting shortness of breath. Arrhythmias: Occasional PVC and PACs. ST Changes: With peak exercise, there were 2-3 mm downsloping ST depressions in leads II, III, aVF, and 2 mm upsloping StTdepressions in leads V4-V6. There is <167mST elevation in lead aVR. ST changes persist 2 min into recovery.  Overall Impression: Positive stress test suggestive of ischemia. ST Changes: With peak exercise there was no ST-T changes of ischemia. Consider further cardiac evaluation including cardiac catheterization if clinically indicated. Impression: Echocardiogram 05/04/2018: 1. Left ventricle cavity is normal in size. Normal left ventricular  shape. Normal global wall motion. Normal diastolic filling pattern. Calculated EF 96%. 2. Trileaflet aortic valve with mild (Grade I) regurgitation. 3. Mild mitral valve leaflet thickening. Borderline prolapse of the mitral valve leaflets. Mild (Grade I) mitral regurgitation. 4. Mild tricuspid regurgitation. No evidence  of pulmonary hypertension. 5. Occasional PVC noted during entire study.Resting EKG 04/30/2018: Normal sinus rhythm at the rate of 78 bpm, normal axis. No evidence of ischemia, normal EKG. Current Plans METABOLIC PANEL, BASIC (32346) CBC & PLATELETS (AUTO) (85027) TSH (THYROID STIMULATING HORMONE) (88737) Abnormal stress BCA(168.38) Current Plans Started Metoprolol Succinate ER 25MG, 1 Tablet twice a day, #60, 30 days starting 05/01/2018, Ref. x1. Started Aspirin 81MG, 1 (one) Tablet daily, #30, 30 days starting 05/01/2018, Ref. x1, Mail Order #90, 90 days, Ref. x3. Hyperlipidemia, mild (E78.5) Laboratory examination (Z01.89) Story: Labs 03/12/2018: Serum glucose 73 mg, even 20, creatinine 0.77, eGFR greater than 60 ML, potassium 4.7. CMP otherwise normal. Total cholesterol 137, triglycerides 71, HDL 55, LDL 68. HB 14.3/HCT. 43.8, platelets 319. Normal indicis.  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 He has not had any chest pain, palpitations, dizziness or syncope. Otherwise remains asymptomatic. I performed a routine GXT to evaluate his symptoms.  Physical examination is unremarkable. I reviewed the results of the treadmill stress test, patient had significant EKG abnormalities with greater than 2 mm downsloping ST depression at low level of exercise, at 5:13 minutes into exercise. He also was symptomatic with dyspnea.  I discussed regarding markedly abnormal  treadmill stress test which is high risk in view of abnormalities at low level of exercise in a patient who exercises regularly. I started him on aspirin 81 mg daily along with metoprolol succinate 25 mg b.i.d., his blood pressure is fairly low, hence will start with once a day as tolerated. Patient instructed not to do heavy lifting, heavy exertional activity, swimming until evaluation is complete. Patient instructed to call if symptoms worse. Schedule trans-thoracic echocardiogram.  We discussed the benefits and risks associated with coronary angiography versus CTA. Patient prefers to have coronary angiography. Schedule for cardiac catheterization, and possible angioplasty. We discussed regarding risks, benefits, alternatives to this including stress testing, CTA and continued medical therapy. Patient wants to proceed. Understands <1-2% risk of death, stroke, MI, urgent CABG, bleeding, infection, renal failure but not limited to these.  CC Dr. Berneta Sages.  Signed by Corey Page, MD (05/05/2018 3:12 PM)

## 2018-05-06 ENCOUNTER — Ambulatory Visit (HOSPITAL_COMMUNITY)
Admission: RE | Admit: 2018-05-06 | Discharge: 2018-05-06 | Disposition: A | Discharge status: Home / Self Care | Payer: BLUE CROSS/BLUE SHIELD | Source: Ambulatory Visit | Attending: Cardiology | Admitting: Cardiology

## 2018-05-06 ENCOUNTER — Encounter (HOSPITAL_COMMUNITY): Payer: Self-pay | Admitting: Cardiology

## 2018-05-06 ENCOUNTER — Ambulatory Visit (HOSPITAL_COMMUNITY)
Admission: RE | Disposition: A | Discharge status: Home / Self Care | Payer: Self-pay | Source: Ambulatory Visit | Attending: Cardiology

## 2018-05-06 DIAGNOSIS — R0602 Shortness of breath: Secondary | ICD-10-CM | POA: Diagnosis present

## 2018-05-06 DIAGNOSIS — E785 Hyperlipidemia, unspecified: Secondary | ICD-10-CM | POA: Diagnosis not present

## 2018-05-06 DIAGNOSIS — I2584 Coronary atherosclerosis due to calcified coronary lesion: Secondary | ICD-10-CM | POA: Diagnosis not present

## 2018-05-06 DIAGNOSIS — Z7982 Long term (current) use of aspirin: Secondary | ICD-10-CM | POA: Insufficient documentation

## 2018-05-06 DIAGNOSIS — I340 Nonrheumatic mitral (valve) insufficiency: Secondary | ICD-10-CM | POA: Insufficient documentation

## 2018-05-06 DIAGNOSIS — R9439 Abnormal result of other cardiovascular function study: Secondary | ICD-10-CM | POA: Diagnosis not present

## 2018-05-06 DIAGNOSIS — I2510 Atherosclerotic heart disease of native coronary artery without angina pectoris: Secondary | ICD-10-CM | POA: Insufficient documentation

## 2018-05-06 HISTORY — PX: LEFT HEART CATH AND CORONARY ANGIOGRAPHY: CATH118249

## 2018-05-06 LAB — POCT ACTIVATED CLOTTING TIME: Activated Clotting Time: 252 seconds

## 2018-05-06 SURGERY — LEFT HEART CATH AND CORONARY ANGIOGRAPHY
Anesthesia: LOCAL

## 2018-05-06 MED ORDER — FENTANYL CITRATE (PF) 100 MCG/2ML IJ SOLN
INTRAMUSCULAR | Status: DC | PRN
  Administered 2018-05-06: 25 ug via INTRAVENOUS

## 2018-05-06 MED ORDER — AMLODIPINE BESYLATE 5 MG PO TABS: 5.0000 mg | ORAL_TABLET | Freq: Every day | ORAL | 2 refills | Status: DC

## 2018-05-06 MED ORDER — SODIUM CHLORIDE 0.9 % WEIGHT BASED INFUSION: 1.0000 mL/kg/h | INTRAVENOUS | Status: DC

## 2018-05-06 MED ORDER — HEPARIN SODIUM (PORCINE) 1000 UNIT/ML IJ SOLN
INTRAMUSCULAR | Status: DC | PRN
  Administered 2018-05-06: 4000 [IU] via INTRAVENOUS
  Administered 2018-05-06: 3000 [IU] via INTRAVENOUS
  Administered 2018-05-06: 2000 [IU] via INTRAVENOUS

## 2018-05-06 MED ORDER — SODIUM CHLORIDE 0.9% FLUSH: 3.0000 mL | INTRAVENOUS | Status: DC | PRN

## 2018-05-06 MED ORDER — SODIUM CHLORIDE 0.9% FLUSH: 3.0000 mL | Freq: Two times a day (BID) | INTRAVENOUS | Status: DC

## 2018-05-06 MED ORDER — ASPIRIN 81 MG PO CHEW
CHEWABLE_TABLET | ORAL | Status: AC
  Administered 2018-05-06: 81 mg via ORAL
  Filled 2018-05-06: qty 1

## 2018-05-06 MED ORDER — HEPARIN SODIUM (PORCINE) 1000 UNIT/ML IJ SOLN
INTRAMUSCULAR | Status: AC
  Filled 2018-05-06: qty 1

## 2018-05-06 MED ORDER — ACETAMINOPHEN 325 MG PO TABS: 650.0000 mg | ORAL_TABLET | ORAL | Status: DC | PRN

## 2018-05-06 MED ORDER — SODIUM CHLORIDE 0.9 % WEIGHT BASED INFUSION
3.0000 mL/kg/h | INTRAVENOUS | Status: DC
  Administered 2018-05-06: 3 mL/kg/h via INTRAVENOUS

## 2018-05-06 MED ORDER — NITROGLYCERIN 1 MG/10 ML FOR IR/CATH LAB
INTRA_ARTERIAL | Status: DC | PRN
  Administered 2018-05-06 (×4): 100 ug via INTRACORONARY

## 2018-05-06 MED ORDER — SODIUM CHLORIDE 0.9 % IV SOLN: 250.0000 mL | INTRAVENOUS | Status: DC | PRN

## 2018-05-06 MED ORDER — IOPAMIDOL (ISOVUE-370) INJECTION 76%
INTRAVENOUS | Status: AC
  Filled 2018-05-06: qty 50

## 2018-05-06 MED ORDER — HEPARIN (PORCINE) IN NACL 1000-0.9 UT/500ML-% IV SOLN
INTRAVENOUS | Status: AC
  Filled 2018-05-06: qty 1000

## 2018-05-06 MED ORDER — VERAPAMIL HCL 2.5 MG/ML IV SOLN
INTRAVENOUS | Status: AC
  Filled 2018-05-06: qty 2

## 2018-05-06 MED ORDER — ROSUVASTATIN CALCIUM 10 MG PO TABS: 20.0000 mg | ORAL_TABLET | Freq: Every day | ORAL | 1 refills | Status: DC

## 2018-05-06 MED ORDER — MIDAZOLAM HCL 2 MG/2ML IJ SOLN
INTRAMUSCULAR | Status: AC
  Filled 2018-05-06: qty 2

## 2018-05-06 MED ORDER — FENTANYL CITRATE (PF) 100 MCG/2ML IJ SOLN
INTRAMUSCULAR | Status: AC
  Filled 2018-05-06: qty 2

## 2018-05-06 MED ORDER — LIDOCAINE HCL (PF) 1 % IJ SOLN
INTRAMUSCULAR | Status: DC | PRN
  Administered 2018-05-06: 2 mL

## 2018-05-06 MED ORDER — LIDOCAINE HCL (PF) 1 % IJ SOLN
INTRAMUSCULAR | Status: AC
  Filled 2018-05-06: qty 30

## 2018-05-06 MED ORDER — SODIUM CHLORIDE 0.9 % IV SOLN
250.0000 mL | INTRAVENOUS | Status: DC | PRN
  Administered 2018-05-06: 500 mL via INTRAVENOUS

## 2018-05-06 MED ORDER — IOPAMIDOL (ISOVUE-370) INJECTION 76%
INTRAVENOUS | Status: DC | PRN
  Administered 2018-05-06: 185 mL via INTRA_ARTERIAL

## 2018-05-06 MED ORDER — IOPAMIDOL (ISOVUE-370) INJECTION 76%
INTRAVENOUS | Status: AC
  Filled 2018-05-06: qty 100

## 2018-05-06 MED ORDER — ONDANSETRON HCL 4 MG/2ML IJ SOLN
4.0000 mg | Freq: Four times a day (QID) | INTRAMUSCULAR | Status: DC | PRN
Start: 2018-05-06 — End: 2018-05-06

## 2018-05-06 MED ORDER — HEPARIN (PORCINE) IN NACL 2-0.9 UNITS/ML
INTRAMUSCULAR | Status: AC | PRN
  Administered 2018-05-06 (×2): 500 mL via INTRA_ARTERIAL

## 2018-05-06 MED ORDER — HEPARIN (PORCINE) IN NACL 2-0.9 UNITS/ML
INTRAMUSCULAR | Status: DC | PRN
  Administered 2018-05-06: 3 mL via INTRA_ARTERIAL

## 2018-05-06 MED ORDER — ASPIRIN 81 MG PO CHEW
81.0000 mg | CHEWABLE_TABLET | ORAL | Status: AC
  Administered 2018-05-06: 81 mg via ORAL

## 2018-05-06 MED ORDER — MIDAZOLAM HCL 2 MG/2ML IJ SOLN
INTRAMUSCULAR | Status: DC | PRN
  Administered 2018-05-06: 2 mg via INTRAVENOUS

## 2018-05-06 SURGICAL SUPPLY — 15 items
CATH OPTICROSS 40MHZ (CATHETERS) ×2 IMPLANT
CATH OPTITORQUE TIG 4.0 5F (CATHETERS) ×2 IMPLANT
CATH VISTA GUIDE 6FR XB3 (CATHETERS) ×2 IMPLANT
CATH VISTA GUIDE 6FR XB3.5 (CATHETERS) ×2 IMPLANT
DEVICE RAD COMP TR BAND LRG (VASCULAR PRODUCTS) ×2 IMPLANT
GLIDESHEATH SLEND A-KIT 6F 20G (SHEATH) ×2 IMPLANT
GUIDEWIRE INQWIRE 1.5J.035X260 (WIRE) ×1 IMPLANT
INQWIRE 1.5J .035X260CM (WIRE) ×2
KIT ESSENTIALS PG (KITS) ×4 IMPLANT
KIT HEART LEFT (KITS) ×2 IMPLANT
PACK CARDIAC CATHETERIZATION (CUSTOM PROCEDURE TRAY) ×2 IMPLANT
SLED PULL BACK IVUS (MISCELLANEOUS) ×2 IMPLANT
TRANSDUCER W/STOPCOCK (MISCELLANEOUS) ×2 IMPLANT
TUBING CIL FLEX 10 FLL-RA (TUBING) ×2 IMPLANT
WIRE COUGAR XT STRL 190CM (WIRE) ×2 IMPLANT

## 2018-05-06 NOTE — Discharge Instructions (Signed)
Drink plenty of fluids over next 48 hours and keep right wrist elevated at heart level for 24 hours ° °Radial Site Care °Refer to this sheet in the next few weeks. These instructions provide you with information about caring for yourself after your procedure. Your health care provider may also give you more specific instructions. Your treatment has been planned according to current medical practices, but problems sometimes occur. Call your health care provider if you have any problems or questions after your procedure. °What can I expect after the procedure? °After your procedure, it is typical to have the following: °· Bruising at the radial site that usually fades within 1-2 weeks. °· Blood collecting in the tissue (hematoma) that may be painful to the touch. It should usually decrease in size and tenderness within 1-2 weeks. ° °Follow these instructions at home: °· Take medicines only as directed by your health care provider. °· You may shower 24-48 hours after the procedure or as directed by your health care provider. Remove the bandage (dressing) and gently wash the site with plain soap and water. Pat the area dry with a clean towel. Do not rub the site, because this may cause bleeding. °· Do not take baths, swim, or use a hot tub until your health care provider approves. °· Check your insertion site every day for redness, swelling, or drainage. °· Do not apply powder or lotion to the site. °· Do not flex or bend the affected arm for 24 hours or as directed by your health care provider. °· Do not push or pull heavy objects with the affected arm for 24 hours or as directed by your health care provider. °· Do not lift over 10 lb (4.5 kg) for 5 days after your procedure or as directed by your health care provider. °· Ask your health care provider when it is okay to: °? Return to work or school. °? Resume usual physical activities or sports. °? Resume sexual activity. °· Do not drive home if you are discharged the  same day as the procedure. Have someone else drive you. °· You may drive 24 hours after the procedure unless otherwise instructed by your health care provider. °· Do not operate machinery or power tools for 24 hours after the procedure. °· If your procedure was done as an outpatient procedure, which means that you went home the same day as your procedure, a responsible adult should be with you for the first 24 hours after you arrive home. °· Keep all follow-up visits as directed by your health care provider. This is important. °Contact a health care provider if: °· You have a fever. °· You have chills. °· You have increased bleeding from the radial site. Hold pressure on the site. °Get help right away if: °· You have unusual pain at the radial site. °· You have redness, warmth, or swelling at the radial site. °· You have drainage (other than a small amount of blood on the dressing) from the radial site. °· The radial site is bleeding, and the bleeding does not stop after 30 minutes of holding steady pressure on the site. °· Your arm or hand becomes pale, cool, tingly, or numb. °This information is not intended to replace advice given to you by your health care provider. Make sure you discuss any questions you have with your health care provider. °Document Released: 01/03/2011 Document Revised: 05/08/2016 Document Reviewed: 06/19/2014 °Elsevier Interactive Patient Education © 2018 [employer] ° °

## 2018-05-06 NOTE — Interval H&P Note (Signed)
History and Physical Interval Note:  05/06/2018 7:33 AM  [name]  has presented today for surgery, with the diagnosis of abnormal stress, shortness of breath  The various methods of treatment have been discussed with the patient and family. After consideration of risks, benefits and other options for treatment, the patient has consented to  Procedure(s): LEFT HEART CATH AND CORONARY ANGIOGRAPHY (N/A) and possible angioplasty as a surgical intervention .  The patient's history has been reviewed, patient examined, no change in status, stable for surgery.  I have reviewed the patient's chart and labs.  Questions were answered to the patient's satisfaction.   Symptom Status: Ischemic Symptoms Non-invasive Testing: High risk If no or indeterminate stress test, FFR/iFR results in all diseased vessels: N/A Diabetes Mellitus: No S/P CABG: No Antianginal therapy (number of long-acting drugs): 1 Patient undergoing renal transplant: No Patient undergoing percutaneous valve procedure: No   1 Vessel Disease No proximal LAD involvement, No proximal left dominant LCX involvement  PCI: M (6);  Indication 2  CABG: M (4);  Indication 2 Proximal left dominant LCX involvement  PCI: A (7);  Indication 5  CABG: A (7);  Indication 5 Proximal LAD involvement  PCI: A (7);  Indication 5  CABG: A (7);  Indication 5  2 Vessel Disease No proximal LAD involvement  PCI: A (7);  Indication 8  CABG: M (6);  Indication 8 Proximal LAD involvement  PCI: A (7);  Indication 11  CABG: A (7);  Indication 11  3 Vessel Disease Low disease complexity (e.g., focal stenoses, SYNTAX <=22)  PCI: A (7);  Indication 17  CABG: A (8);  Indication 17 Intermediate or high disease complexity (e.g., SYNTAX >=23)  PCI: M (6);  Indication 21  CABG: A (8);  Indication 21  Left Main Disease Isolated LMCA disease: ostial or midshaft  PCI: A (7);  Indication 24  CABG: A (9);  Indication 24 Isolated LMCA disease: bifurcation  involvement  PCI: M (5);  Indication 25  CABG: A (9);  Indication 25 LMCA ostial or midshaft, concurrent low disease burden multivessel disease (e.g., 1-2 additional focal stenoses, SYNTAX <=22)  PCI: A (7);  Indication 26  CABG: A (9);  Indication 26 LMCA ostial or midshaft, concurrent intermediate or high disease burden multivessel disease (e.g., 1-2 additional bifurcation stenoses, long stenoses, SYNTAX >=23)  PCI: M (4);  Indication 27  CABG: A (9);  Indication 27 LMCA bifurcation involvement, concurrent low disease burden multivessel disease (e.g., 1-2 additional focal stenoses, SYNTAX <=22)  PCI: M (5);  Indication 28  CABG: A (9);  Indication 28 LMCA bifurcation involvement, concurrent intermediate or high disease burden multivessel disease (e.g., 1-2 additional bifurcation stenoses, long stenoses, SYNTAX >=23)  PCI: R (3);  Indication 29  CABG: A (9);  Indication 29  Notes:  A indicates appropriate. M indicates may be appropriate. R indicates rarely appropriate. Number in parentheses is median score for that indication. Reclassify indicates number of functionally diseased vessels should be decreased given negative FFR/iFR. Re-evaluate the scenario interpreting any FFR/iFR negative vessel as being not significantly stenosed.  Disease means involved vessel provides flow to a sufficient amount of myocardium to be clinically important.  If FFR testing indicates a vessel is not significant, that vessel should not be considered diseased (and the patient should be reclassified with respect to extent of functionally significant disease).  Proximal LAD + proximal left dominant LCX is considered 3 vessel CAD  2 Vessel CAD with FFR/iFR abnormal in only 1  but not both is considered 1 vessel CAD  Disease complexity includes occlusion, bifurcation, trifurcation, ostial, >20 mm, tortuosity, calcification, thrombus  LMCA disease is >=50% by angiography, MLD <2.8 mm, MLA <6 mm2; MLA 6-7.5 mm2 requires  further physiologic  See Table B for risk stratification based on noninvasive testing  Journal of the [employer] of Cardiology Mar 2017, 23391; DOI: 10.1016/j.jacc.2017.02.001 [email or web address].de.[email or web address].full-text.pdf This App  2018 by the Society for Cardiovascular Angiography and Interventions   Adrian Prows

## 2018-05-07 MED FILL — Heparin Sod (Porcine)-NaCl IV Soln 1000 Unit/500ML-0.9%: INTRAVENOUS | Qty: 1000 | Status: AC

## 2018-05-14 DIAGNOSIS — Z0189 Encounter for other specified special examinations: Secondary | ICD-10-CM | POA: Diagnosis not present

## 2018-05-14 DIAGNOSIS — R0609 Other forms of dyspnea: Secondary | ICD-10-CM | POA: Diagnosis not present

## 2018-05-14 DIAGNOSIS — E785 Hyperlipidemia, unspecified: Secondary | ICD-10-CM | POA: Diagnosis not present

## 2018-05-26 ENCOUNTER — Ambulatory Visit (INDEPENDENT_AMBULATORY_CARE_PROVIDER_SITE_OTHER): Payer: Self-pay

## 2018-05-26 ENCOUNTER — Ambulatory Visit (INDEPENDENT_AMBULATORY_CARE_PROVIDER_SITE_OTHER): Payer: BLUE CROSS/BLUE SHIELD | Admitting: Rheumatology

## 2018-05-26 ENCOUNTER — Encounter: Payer: Self-pay | Admitting: Rheumatology

## 2018-05-26 VITALS — BP 113/70 | HR 64 | Ht 69.0 in | Wt 150.0 lb

## 2018-05-26 DIAGNOSIS — M898X6 Other specified disorders of bone, lower leg: Secondary | ICD-10-CM

## 2018-05-26 DIAGNOSIS — M79661 Pain in right lower leg: Secondary | ICD-10-CM | POA: Diagnosis not present

## 2018-05-26 DIAGNOSIS — Z8719 Personal history of other diseases of the digestive system: Secondary | ICD-10-CM

## 2018-05-26 DIAGNOSIS — E7800 Pure hypercholesterolemia, unspecified: Secondary | ICD-10-CM | POA: Diagnosis not present

## 2018-05-26 DIAGNOSIS — M7671 Peroneal tendinitis, right leg: Secondary | ICD-10-CM | POA: Diagnosis not present

## 2018-05-26 NOTE — Progress Notes (Signed)
Office Visit Note  Patient: [name]             Date of Birth: 07/14/1956           MRN: [id number]             PCP: Prince Solian, MD Referring: Prince Solian, MD Visit Date: 05/26/2018 Occupation: @GUAROCC @    Subjective:  Pain of the Right Ankle and leg   History of Present Illness: [name] is a 62 y.o. male seen on urgent basis due to pain and discomfort in his right ankle.  He states the pain has been intermittent in its on the lateral aspect of his right foot.  It starts in the mid calf region and goes down.  He sometimes can reproduce pain by pressing on the side of his calf.  He has not had any Achillis tendon swelling.  Activities of Daily Living:  Patient reports morning stiffness for 0 minute.   Patient Denies nocturnal pain.  Difficulty dressing/grooming: Denies Difficulty climbing stairs: Denies Difficulty getting out of chair: Denies Difficulty using hands for taps, buttons, cutlery, and/or writing: Denies   Review of Systems  Constitutional: Negative for fatigue and night sweats.  HENT: Negative for mouth sores, mouth dryness and nose dryness.   Eyes: Negative for redness and dryness.  Respiratory: Negative for shortness of breath and difficulty breathing.   Cardiovascular: Negative for chest pain, palpitations, hypertension, irregular heartbeat and swelling in legs/feet.  Gastrointestinal: Negative for constipation and diarrhea.  Endocrine: Negative for increased urination.  Musculoskeletal: Negative for arthralgias, joint pain, joint swelling, myalgias, muscle weakness, morning stiffness, muscle tenderness and myalgias.  Skin: Negative for color change, rash, hair loss, nodules/bumps, skin tightness, ulcers and sensitivity to sunlight.  Allergic/Immunologic: Negative for susceptible to infections.  Neurological: Negative for dizziness, fainting, memory loss, night sweats and weakness ( ).  Hematological: Negative for swollen glands.    Psychiatric/Behavioral: Negative for depressed mood and sleep disturbance. The patient is not nervous/anxious.     PMFS History:  Patient Active Problem List   Diagnosis Date Noted  . Shortness of breath 05/05/2018  . Abnormal stress ECG with treadmill 05/05/2018  . Hypercholesteremia   . Ulcerative colitis     Past Medical History:  Diagnosis Date  . Hypercholesteremia   . Ulcerative colitis     History reviewed. No pertinent family history. Past Surgical History:  Procedure Laterality Date  . LEFT HEART CATH AND CORONARY ANGIOGRAPHY N/A 05/06/2018   Procedure: LEFT HEART CATH AND CORONARY ANGIOGRAPHY;  Surgeon: Adrian Prows, MD;  Location: [facility];  Service: Cardiovascular;  Laterality: N/A;   Social History   Social History Narrative  . Not on file     Objective: Vital Signs: BP 113/70 (BP Location: Left Arm, Patient Position: Sitting, Cuff Size: Normal)   Pulse 64   Ht 5' 9"  (1.753 m)   Wt 150 lb (68 kg)   BMI 22.15 kg/m    Physical Exam  Constitutional: He is oriented to person, place, and time. He appears well-developed and well-nourished.  HENT:  Head: Normocephalic and atraumatic.  Eyes: Pupils are equal, round, and reactive to light. Conjunctivae and EOM are normal.  Neck: Normal range of motion. Neck supple.  Cardiovascular: Normal rate, regular rhythm and normal heart sounds.  Pulmonary/Chest: Effort normal and breath sounds normal.  Abdominal: Soft. Bowel sounds are normal.  Neurological: He is alert and oriented to person, place, and time.  Skin: Skin is warm and  dry. Capillary refill takes less than 2 seconds.  Psychiatric: He has a normal mood and affect. His behavior is normal.  Nursing note and vitals reviewed.    Musculoskeletal Exam: C-spine thoracic lumbar spine good range of motion.  Shoulder joints elbow joints wrist joint MCPs PIPs DIPs with good range of motion.  Hip joints knee joints ankles MTPs PIPs DIPs in good range of motion.   He had no Achilles tendinitis or plantar fasciitis.  He has some tenderness over the peroneus longus tendon.  CDAI Exam: No CDAI exam completed.    Investigation: No additional findings.   Imaging: Xr Tibia/fibula Right  Result Date: 05/26/2018 No abnormalities of tibiofibular were noted.  No cortical defects were noted.   Speciality Comments: No specialty comments available.    Procedures:  No procedures performed Allergies: Patient has no known allergies.   Assessment / Plan:     Visit Diagnoses: Tendinitis of right peroneus longus tendon-he has been having intermittent pain in the right peroneus longus tendon.  He has been using diclofenac gel which has been helpful use it.  He is over pronator which I believe is contributing to some of the tendinitis.  Corrective shoes with shoe inserts were discussed.  If his symptoms persist he is supposed to notify me.  He may benefit from physical therapy.  Pain of right fibula - Plan: XR Tibia/Fibula Right.  The x-rays were unremarkable.  History of ulcerative colitis-in remission per patient.  Hypercholesteremia -he is on statins.   Orders: Orders Placed This Encounter  Procedures  . XR Tibia/Fibula Right   No orders of the defined types were placed in this encounter.    Follow-Up Instructions: Return if symptoms worsen or fail to improve.   Bo Merino, MD  Note - This record has been created using Editor, commissioning.  Chart creation errors have been sought, but may not always  have been located. Such creation errors do not reflect on  the standard of medical care.

## 2018-05-27 ENCOUNTER — Telehealth: Payer: Self-pay | Admitting: Radiology

## 2018-05-27 NOTE — Telephone Encounter (Signed)
Pt called stated he has changed his mind and is requesting to pick up lace up ankle brace. Brace and insurance sign sheet placed up front for pick up.

## 2018-06-08 ENCOUNTER — Ambulatory Visit
Admission: RE | Admit: 2018-06-08 | Discharge: 2018-06-08 | Disposition: A | Discharge status: Home / Self Care | Payer: BLUE CROSS/BLUE SHIELD | Source: Ambulatory Visit | Attending: Cardiology | Admitting: Cardiology

## 2018-06-08 ENCOUNTER — Other Ambulatory Visit: Payer: Self-pay | Admitting: Cardiology

## 2018-06-08 DIAGNOSIS — M79605 Pain in left leg: Secondary | ICD-10-CM

## 2018-06-08 DIAGNOSIS — R609 Edema, unspecified: Secondary | ICD-10-CM

## 2018-06-08 DIAGNOSIS — R600 Localized edema: Secondary | ICD-10-CM | POA: Diagnosis not present

## 2018-07-04 ENCOUNTER — Emergency Department (HOSPITAL_COMMUNITY)
Admission: EM | Admit: 2018-07-04 | Discharge: 2018-07-04 | Disposition: A | Discharge status: Home / Self Care | Payer: BLUE CROSS/BLUE SHIELD | Attending: Emergency Medicine | Admitting: Emergency Medicine

## 2018-07-04 DIAGNOSIS — Z79899 Other long term (current) drug therapy: Secondary | ICD-10-CM | POA: Diagnosis not present

## 2018-07-04 DIAGNOSIS — Z7982 Long term (current) use of aspirin: Secondary | ICD-10-CM | POA: Insufficient documentation

## 2018-07-04 DIAGNOSIS — R002 Palpitations: Secondary | ICD-10-CM | POA: Diagnosis not present

## 2018-07-04 DIAGNOSIS — I4891 Unspecified atrial fibrillation: Secondary | ICD-10-CM | POA: Insufficient documentation

## 2018-07-04 DIAGNOSIS — R000 Tachycardia, unspecified: Secondary | ICD-10-CM | POA: Diagnosis not present

## 2018-07-04 LAB — I-STAT CHEM 8, ED
BUN: 21 mg/dL (ref 8–23)
CALCIUM ION: 1.16 mmol/L (ref 1.15–1.40)
CHLORIDE: 106 mmol/L (ref 98–111)
Creatinine, Ser: 0.8 mg/dL (ref 0.61–1.24)
Glucose, Bld: 87 mg/dL (ref 70–99)
HEMATOCRIT: 41 % (ref 39.0–52.0)
Hemoglobin: 13.9 g/dL (ref 13.0–17.0)
Potassium: 3.8 mmol/L (ref 3.5–5.1)
SODIUM: 141 mmol/L (ref 135–145)
TCO2: 23 mmol/L (ref 22–32)

## 2018-07-04 LAB — MAGNESIUM: Magnesium: 2.2 mg/dL (ref 1.7–2.4)

## 2018-07-04 MED ORDER — PROPOFOL 10 MG/ML IV BOLUS
1.0000 mg/kg | Freq: Once | INTRAVENOUS | Status: AC
Start: 2018-07-04 — End: 2018-07-04
  Administered 2018-07-04: 70 mg via INTRAVENOUS
  Filled 2018-07-04: qty 20

## 2018-07-04 MED ORDER — SODIUM CHLORIDE 0.9 % IV BOLUS
1000.0000 mL | Freq: Once | INTRAVENOUS | Status: AC
  Administered 2018-07-04: 1000 mL via INTRAVENOUS

## 2018-07-04 MED ORDER — APIXABAN 5 MG PO TABS
5.0000 mg | ORAL_TABLET | Freq: Once | ORAL | Status: AC
  Administered 2018-07-04: 5 mg via ORAL
  Filled 2018-07-04: qty 1

## 2018-07-04 MED ORDER — DILTIAZEM HCL 25 MG/5ML IV SOLN
20.0000 mg | Freq: Once | INTRAVENOUS | Status: AC
  Administered 2018-07-04: 20 mg via INTRAVENOUS
  Filled 2018-07-04: qty 5

## 2018-07-04 NOTE — Sedation Documentation (Signed)
Pt shocked with 100 jules

## 2018-07-04 NOTE — ED Notes (Signed)
Pt stable, ambulatory, and verbalizes understanding of d/c instructions.  

## 2018-07-04 NOTE — Discharge Instructions (Addendum)

## 2018-07-04 NOTE — H&P (Signed)
[name] is an 62 y.o. male.   Chief Complaint: Palpitations HPI: [name]  is a 62 y.o. male  With mild hyperlipidemia on therapy, has had coronary angiography in May 2019 for abnormal stress test and chest discomfort was found to have very minimal luminal irregularity but no significant coronary disease and normal LVEF.  He woke up this morning thinking that he is having a bad dream and felt palpitations.  He was found to be in A. fib with RVR, seen in the emergency room by me.  Except for palpitations, he has no other complaints.  No chest pain, he did 10 laps of swimming last evening.  Past Medical History:  Diagnosis Date  . Hypercholesteremia   . Ulcerative colitis     Past Surgical History:  Procedure Laterality Date  . LEFT HEART CATH AND CORONARY ANGIOGRAPHY N/A 05/06/2018   Procedure: LEFT HEART CATH AND CORONARY ANGIOGRAPHY;  Surgeon: Adrian Prows, MD;  Location: [facility];  Service: Cardiovascular;  Laterality: N/A;    Family history: No premature CAD in family. Mother had A. Fib Social History:  reports that he has never smoked. He has never used smokeless tobacco. He reports that he does not drink alcohol or use drugs.  Allergies: No Known Allergies  Review of Systems - All other systems are negative except for palpitations.  Blood pressure (!) 120/91, pulse (!) 130, temperature 97.7 F (36.5 C), temperature source Oral, resp. rate 19, height 5' 9"  (1.753 m), weight 67.1 kg (148 lb), SpO2 100 %. Body mass index is 21.86 kg/m.  General appearance: alert, cooperative, appears stated age and no distress Eyes: negative findings: lids and lashes normal Neck: no adenopathy, no carotid bruit, no JVD, supple, symmetrical, trachea midline and thyroid not enlarged, symmetric, no tenderness/mass/nodules Neck: JVP - normal, carotids 2+= without bruits Resp: clear to auscultation bilaterally Chest wall: no tenderness Cardio: S1 is variable, S2 is normal, irregularly  irregular rhythm.  No gallop or murmur appreciated. GI: soft, non-tender; bowel sounds normal; no masses,  no organomegaly Extremities: extremities normal, atraumatic, no cyanosis or edema Pulses: 2+ and symmetric Skin: Skin color, texture, turgor normal. No rashes or lesions Neurologic: Grossly normal  No results found for this or any previous visit (from the past 48 hour(s)).  Labs:   Lab Results  Component Value Date   WBC 7.0 01/25/2012   HGB 15.2 01/25/2012   HCT 44.1 01/25/2012   MCV 88.7 01/25/2012   PLT 199 01/25/2012   No results for input(s): NA, K, CL, CO2, BUN, CREATININE, CALCIUM, PROT, BILITOT, ALKPHOS, ALT, AST, GLUCOSE in the last 168 hours.  Invalid input(s): LABALBU   Current Facility-Administered Medications:  .  propofol (DIPRIVAN) 10 mg/mL bolus/IV push 67.1 mg, 1 mg/kg, Intravenous, Once, Sherwood Gambler, MD .  sodium chloride 0.9 % bolus 1,000 mL, 1,000 mL, Intravenous, Once, Sherwood Gambler, MD, Last Rate: 984 mL/hr at 07/04/18 0814, 1,000 mL at 07/04/18 0814  Current Outpatient Medications:  .  aspirin EC 81 MG tablet, Take 81 mg by mouth daily., Disp: , Rfl:  .  metoprolol succinate (TOPROL-XL) 25 MG 24 hr tablet, Take 12.5 mg by mouth daily. , Disp: , Rfl:  .  fluticasone (FLONASE) 50 MCG/ACT nasal spray, Place 1 spray into both nostrils daily., Disp: , Rfl:  .  mesalamine (LIALDA) 1.2 G EC tablet, Take 1,200 mg by mouth daily with breakfast. , Disp: , Rfl:  .  rosuvastatin (CRESTOR) 10 MG tablet, Take 2 tablets (  20 mg total) by mouth daily., Disp: 90 tablet, Rfl: 1  CARDIAC STUDIES:  Coronary angiogram 05/06/2018: Normal LV systolic function, normal LVEDP.  Minimal calcification in the proximal and mid LAD, hazy 10% stenosis, IVUS reveals minimal calcification and moderate amount of soft plaque constituting about 30% stenosis.  There is no ulcerated plaque.  Slow flow evident improved with IC nitro. Circumflex coronary artery proximal to mid segment  has hazy 10 to 15% stenosis.  Slow flow is evident.  Improved with NTG. RCA: Dominant.  No significant disease.  EKG 07/04/2018: Atrial fibrillation with rapid ventricular response with nonspecific ST-T abnormality.  Echocardiogram 05/04/2018: 1. Left ventricle cavity is normal in size. Normal left ventricular shape. Normal global wall motion. Normal diastolic filling pattern. Calculated EF 96%. 2. Trileaflet aortic valve with mild (Grade I) regurgitation. 3. Mild mitral valve leaflet thickening. Borderline prolapse of the mitral valve leaflets. Mild (Grade I) mitral regurgitation. 4. Mild tricuspid regurgitation. No evidence of pulmonary hypertension. 5. Occasional PVC noted during entire study.  Assessment/Plan 1.  New onset of atrial fibrillation with rapid ventricular response 2.  Hyperlipidemia  Recommendation: Patient's CHADSVASC is 0. He has new onset of atrial fibrillation that started this morning at around 6:15 AM.  I will try diltiazem x1, if he does not cardiovert, will proceed with direct-current cardioversion.  He will need anticoagulation for at least 4 weeks after cardioversion.  Blood pressure is slightly elevated today, but previously he is nonhypertensive, in fact runs borderline low blood pressure.  No other significant medical history. He takes Metoprolol succinate for palpitations at 12.5 mg daily, took one extra dose this morning. I have discussed the risks and benefits and he is agreeable.   Adrian Prows, MD 07/04/2018, 8:07 AM [facility]. [place] Pager: [phone] Office: [phone] If no answer: Cell:  [phone]

## 2018-07-04 NOTE — ED Triage Notes (Signed)
Pt to ED via private vehicle with c/o afib- brought in by Dr. Einar Gip - his neighbor, pt is alert/oriented x 4, w/d, denies any chest pain.  States that he woke up with palpitations. Recently had clean cath-  Last meal last night.

## 2018-07-04 NOTE — ED Provider Notes (Signed)
[facility] EMERGENCY DEPARTMENT Provider Note   CSN: [id number] Arrival date & time: 07/04/18  0746     History   Chief Complaint Chief Complaint  Patient presents with  . Atrial Fibrillation    HPI [name] is a 62 y.o. male.  HPI  62 year old male with a history of hypercholesterolemia and ulcerative colitis presents with palpitations.  He states he is occasionally had PACs in the past and is treated with metoprolol.  However this morning since around 6 AM he is been having consistent palpitations.  There is no chest pain or shortness of breath.  No dizziness. He had a cardiac catheterization in May 2019 that showed minimal stenosis/calcification but no significant.  Coronary disease his cardiologist is Dr. Einar Gip and after evaluation this morning, was felt to be in A. fib and sent here for cardioversion.  Past Medical History:  Diagnosis Date  . Hypercholesteremia   . Ulcerative colitis     Patient Active Problem List   Diagnosis Date Noted  . Shortness of breath 05/05/2018  . Abnormal stress ECG with treadmill 05/05/2018  . Hypercholesteremia   . Ulcerative colitis     Past Surgical History:  Procedure Laterality Date  . LEFT HEART CATH AND CORONARY ANGIOGRAPHY N/A 05/06/2018   Procedure: LEFT HEART CATH AND CORONARY ANGIOGRAPHY;  Surgeon: Adrian Prows, MD;  Location: [facility];  Service: Cardiovascular;  Laterality: N/A;        Home Medications    Prior to Admission medications   Medication Sig Start Date End Date Taking? Authorizing Provider  aspirin EC 81 MG tablet Take 81 mg by mouth daily.   Yes [provider]  fluticasone (FLONASE) 50 MCG/ACT nasal spray Place 1 spray into both nostrils daily.   Yes [provider]  mesalamine (LIALDA) 1.2 G EC tablet Take 1.2 g by mouth daily with breakfast.    Yes [provider]  metoprolol succinate (TOPROL-XL) 25 MG 24 hr tablet Take 12.5 mg by mouth daily.    Yes  [provider]  rosuvastatin (CRESTOR) 10 MG tablet Take 2 tablets (20 mg total) by mouth daily. 05/06/18  Yes Adrian Prows, MD    Family History No family history on file.  Social History Social History   Tobacco Use  . Smoking status: Never Smoker  . Smokeless tobacco: Never Used  Substance Use Topics  . Alcohol use: No  . Drug use: No     Allergies   Patient has no known allergies.   Review of Systems Review of Systems  Respiratory: Negative for shortness of breath.   Cardiovascular: Positive for palpitations. Negative for chest pain.  Neurological: Negative for dizziness and light-headedness.  All other systems reviewed and are negative.    Physical Exam Updated Vital Signs BP 117/68   Pulse 63   Temp 97.7 F (36.5 C) (Oral)   Resp 14   Ht 5' 9"  (1.753 m)   Wt 67.1 kg (148 lb)   SpO2 100%   BMI 21.86 kg/m   Physical Exam  Constitutional: He is oriented to person, place, and time. He appears well-developed and well-nourished.  HENT:  Head: Normocephalic and atraumatic.  Right Ear: External ear normal.  Left Ear: External ear normal.  Nose: Nose normal.  Eyes: Right eye exhibits no discharge. Left eye exhibits no discharge.  Neck: Neck supple.  Cardiovascular: Normal heart sounds. An irregular rhythm present. Tachycardia present.  Pulmonary/Chest: Effort normal and breath sounds normal.  Abdominal: Soft. He exhibits no distension. There is no tenderness.  Musculoskeletal: He exhibits no edema.  Neurological: He is alert and oriented to person, place, and time.  Skin: Skin is warm and dry.  Nursing note and vitals reviewed.    ED Treatments / Results  Labs (all labs ordered are listed, but only abnormal results are displayed) Labs Reviewed  MAGNESIUM  I-STAT CHEM 8, ED    EKG EKG Interpretation  Date/Time:  Sunday July 04 2018 08:00:09 EDT Ventricular Rate:  136 PR Interval:    QRS Duration: 91 QT Interval:  322 QTC  Calculation: 485 R Axis:   92 Text Interpretation:  Atrial fibrillation with RVR Right axis deviation Borderline ST depression, diffuse leads Borderline prolonged QT interval Baseline wander in lead(s) V1 afib new since 2013 Confirmed by Sherwood Gambler [phone]) on 07/04/2018 8:04:50 AM   EKG Interpretation  Date/Time:  Sunday July 04 2018 08:49:05 EDT Ventricular Rate:  70 PR Interval:    QRS Duration: 90 QT Interval:  405 QTC Calculation: 437 R Axis:   107 Text Interpretation:  Sinus rhythm Right axis deviation Low voltage, precordial leads afib has resolved Confirmed by Sherwood Gambler [phone]) on 07/04/2018 9:41:37 AM       Radiology No results found.  Procedures .Sedation Date/Time: 07/04/2018 8:56 AM Performed by: Sherwood Gambler, MD Authorized by: Sherwood Gambler, MD   Consent:    Consent obtained:  Verbal and written   Consent given by:  Patient Universal protocol:    Immediately prior to procedure a time out was called: yes     Patient identity confirmation method:  Verbally with patient Indications:    Procedure performed:  Cardioversion   Procedure necessitating sedation performed by:  Different physician   Intended level of sedation:  Deep Pre-sedation assessment:    Time since last food or drink:  Last night   ASA classification: class 1 - normal, healthy patient     Neck mobility: normal     Mallampati score:  I - soft palate, uvula, fauces, pillars visible   Pre-sedation assessments completed and reviewed: airway patency, cardiovascular function, hydration status, mental status, nausea/vomiting, pain level, respiratory function and temperature   Immediate pre-procedure details:    Reviewed: vital signs and relevant labs/tests     Verified: bag valve mask available, emergency equipment available, intubation equipment available, IV patency confirmed, oxygen available and suction available   Procedure details (see MAR for exact dosages):    Preoxygenation:  Nasal  cannula   Sedation:  Propofol   Intra-procedure monitoring:  Blood pressure monitoring, cardiac monitor, continuous pulse oximetry, continuous capnometry, frequent LOC assessments and frequent vital sign checks   Intra-procedure events: none     Total Provider sedation time (minutes):  5 Post-procedure details:    Patient is stable for discharge or admission: yes     Patient tolerance:  Tolerated well, no immediate complications   (including critical care time)  Medications Ordered in ED Medications  sodium chloride 0.9 % bolus 1,000 mL (0 mLs Intravenous Stopped 07/04/18 0926)  diltiazem (CARDIZEM) injection 20 mg (20 mg Intravenous Given 07/04/18 0816)  propofol (DIPRIVAN) 10 mg/mL bolus/IV push 67.1 mg (70 mg Intravenous Given 07/04/18 0845)  apixaban (ELIQUIS) tablet 5 mg (5 mg Oral Given 07/04/18 0948)     Initial Impression / Assessment and Plan / ED Course  I have reviewed the triage vital signs and the nursing notes.  Pertinent labs & imaging results that were available during my care  of the patient were reviewed by me and considered in my medical decision making (see chart for details).     Patient presents with new onset atrial fibrillation.  Brought in by his cardiologist Dr. Einar Gip.  Given the new onset within only a couple hours, this is an ideal time to cardiovert and this was expanded patient who agrees.  I provided the procedural sedation as above as Dr. Einar Gip cardioverted.  The patient went into normal sinus rhythm and has stayed in normal sinus rhythm.  He is feeling well and has had no adverse effects from the sedation or cardioversion.  He has not been having any anginal symptoms.  Magnesium and Chem-8 are benign.  He appears stable for discharge home.  He was given a dose of Eliquis here and his cardiologist will provide to further Eliquis as an outpatient.  Final Clinical Impressions(s) / ED Diagnoses   Final diagnoses:  New onset atrial fibrillation [facility])    ED  Discharge Orders    None       Sherwood Gambler, MD 07/04/18 1011

## 2018-07-04 NOTE — Discharge Summary (Signed)
He will be discharged home on Eliquis for a month. Event monitor for a month.

## 2018-07-04 NOTE — Sedation Documentation (Signed)
Pt converted to ns

## 2018-07-04 NOTE — CV Procedure (Signed)
Direct current cardioversion:  Indication symptomatic A. Fibrillation.  Procedure: Using 70 mg of IV Propofol for achieving deep sedation, synchronized direct current cardioversion performed. Patient was delivered with 100 Joules of electricity X 1 with success to NSR. Patient tolerated the procedure well. No immediate complication noted. Dr. Gwynneth Aliment, Herbie [place] assisted with with sedation.

## 2018-07-27 DIAGNOSIS — R0609 Other forms of dyspnea: Secondary | ICD-10-CM | POA: Diagnosis not present

## 2018-07-27 DIAGNOSIS — Z0189 Encounter for other specified special examinations: Secondary | ICD-10-CM | POA: Diagnosis not present

## 2018-07-27 DIAGNOSIS — I480 Paroxysmal atrial fibrillation: Secondary | ICD-10-CM | POA: Diagnosis not present

## 2018-07-27 DIAGNOSIS — E785 Hyperlipidemia, unspecified: Secondary | ICD-10-CM | POA: Diagnosis not present

## 2018-08-04 DIAGNOSIS — I4891 Unspecified atrial fibrillation: Secondary | ICD-10-CM | POA: Diagnosis not present

## 2018-08-06 DIAGNOSIS — E785 Hyperlipidemia, unspecified: Secondary | ICD-10-CM | POA: Diagnosis not present

## 2018-08-06 DIAGNOSIS — I480 Paroxysmal atrial fibrillation: Secondary | ICD-10-CM | POA: Diagnosis not present

## 2018-08-14 ENCOUNTER — Encounter: Payer: Self-pay | Admitting: Rheumatology

## 2018-08-17 MED ORDER — DICLOFENAC SODIUM 1 % TD GEL: TRANSDERMAL | 3 refills | Status: DC

## 2018-08-17 NOTE — Telephone Encounter (Signed)
Per Dr. Estanislado Pandy okay to send in prescription.

## 2018-08-24 DIAGNOSIS — Z0000 Encounter for general adult medical examination without abnormal findings: Secondary | ICD-10-CM | POA: Diagnosis not present

## 2018-08-24 DIAGNOSIS — Z1389 Encounter for screening for other disorder: Secondary | ICD-10-CM | POA: Diagnosis not present

## 2018-08-24 DIAGNOSIS — Z6822 Body mass index (BMI) 22.0-22.9, adult: Secondary | ICD-10-CM | POA: Diagnosis not present

## 2019-02-04 ENCOUNTER — Ambulatory Visit: Payer: Self-pay | Admitting: Cardiology

## 2019-02-07 DIAGNOSIS — R05 Cough: Secondary | ICD-10-CM | POA: Diagnosis not present

## 2019-02-07 DIAGNOSIS — I4891 Unspecified atrial fibrillation: Secondary | ICD-10-CM | POA: Diagnosis not present

## 2019-02-07 DIAGNOSIS — Z6823 Body mass index (BMI) 23.0-23.9, adult: Secondary | ICD-10-CM | POA: Diagnosis not present

## 2019-02-07 DIAGNOSIS — J111 Influenza due to unidentified influenza virus with other respiratory manifestations: Secondary | ICD-10-CM | POA: Diagnosis not present

## 2019-02-07 DIAGNOSIS — J069 Acute upper respiratory infection, unspecified: Secondary | ICD-10-CM | POA: Diagnosis not present

## 2019-02-15 ENCOUNTER — Ambulatory Visit (INDEPENDENT_AMBULATORY_CARE_PROVIDER_SITE_OTHER): Payer: BLUE CROSS/BLUE SHIELD | Admitting: Cardiology

## 2019-02-15 ENCOUNTER — Encounter: Payer: Self-pay | Admitting: Cardiology

## 2019-02-15 VITALS — BP 131/78 | HR 62 | Ht 69.0 in | Wt 152.0 lb

## 2019-02-15 DIAGNOSIS — Z8679 Personal history of other diseases of the circulatory system: Secondary | ICD-10-CM | POA: Diagnosis not present

## 2019-02-15 DIAGNOSIS — E7800 Pure hypercholesterolemia, unspecified: Secondary | ICD-10-CM | POA: Diagnosis not present

## 2019-02-15 NOTE — Progress Notes (Signed)
Subjective:  Primary Physician:  Prince Solian, MD  Patient ID: [name], male    DOB: 12-08-1956, 63 y.o.   MRN: [id number]  Chief Complaint  Patient presents with  . Atrial Fibrillation  . Follow-up    68mo   HPI: [name] is a 63y.o. male  with mild hyperlipidemia, who swims almost every day, over the past few months has noticed decrease in exercise tolerance and also mild dyspnea. He underwent routine treadmill exercise stress test on 04/30/2018 which is markedly abnormal with EKG changes of ischemia with moderately reduced exercise tolerance, echocardiogram was essentially unremarkable except for mild mitral valve prolapse and MR and mild TR. He underwent cardiac catheterization on 05/06/2018 and was found to have moderate coronary artery disease involving the LAD and circumflex.  He had been doing well until 07/04/2018, woke up around 6 AM in the morning with rapid heart and he had called me regarding this, and he was found to be in atrial fibrillation with rapid ventricular response. He underwent direct current cardioversion and an event monitor for 30 days, revealing PACs and occasional PVCs but no recurrence of atrial fibrillation.  Due to low cardioembolic risk, he is presently not on anticoagulation.  About a week ago he had flu which is recovering from, feels fatigued and slightly short of breath when he exerts himself but otherwise no other specific symptoms except for chronic palpitations.  He is wearing "apple watch" and has not documented any atrial fibrillation.   Past Medical History:  Diagnosis Date  . Hypercholesteremia   . Ulcerative colitis     Past Surgical History:  Procedure Laterality Date  . LEFT HEART CATH AND CORONARY ANGIOGRAPHY N/A 05/06/2018   Procedure: LEFT HEART CATH AND CORONARY ANGIOGRAPHY;  Surgeon: GAdrian Prows MD;  [facility];  Service: Cardiovascular;  Laterality: N/A;    Social History   Socioeconomic History  .  Marital status: Married    Spouse name: Not on file  . Number of children: 2  . Years of education: Not on file  . Highest education level: Not on file  Occupational History  . Not on file  Social Needs  . Financial resource strain: Not on file  . Food insecurity:    Worry: Not on file    Inability: Not on file  . Transportation needs:    Medical: Not on file    Non-medical: Not on file  Tobacco Use  . Smoking status: Never Smoker  . Smokeless tobacco: Never Used  Substance and Sexual Activity  . Alcohol use: No  . Drug use: No  . Sexual activity: Not on file  Lifestyle  . Physical activity:    Days per week: Not on file    Minutes per session: Not on file  . Stress: Not on file  Relationships  . Social connections:    Talks on phone: Not on file    Gets together: Not on file    Attends religious service: Not on file    Active member of club or organization: Not on file    Attends meetings of clubs or organizations: Not on file    Relationship status: Not on file  . Intimate partner violence:    Fear of current or ex partner: Not on file    Emotionally abused: Not on file    Physically abused: Not on file    Forced sexual activity: Not on file  Other Topics Concern  .  Not on file  Social History Narrative  . Not on file    Current Outpatient Medications on File Prior to Visit  Medication Sig Dispense Refill  . benzonatate (TESSALON) 200 MG capsule continuous as needed.    . diclofenac sodium (VOLTAREN) 1 % GEL 3 grams to 3 large joints up to 3 times daily (Patient taking differently: continuous as needed. 3 grams to 3 large joints) 3 Tube 3  . fluticasone (FLONASE) 50 MCG/ACT nasal spray Place 1 spray into both nostrils daily.    . mesalamine (LIALDA) 1.2 G EC tablet Take 1.2 g by mouth daily with breakfast.     . metoprolol succinate (TOPROL-XL) 25 MG 24 hr tablet Take 25 mg by mouth daily. 1.5 tab qd    . nystatin cream (MYCOSTATIN) continuous as needed.    .  rosuvastatin (CRESTOR) 10 MG tablet Take 2 tablets (20 mg total) by mouth daily. 90 tablet 1  . Zolpidem Tartrate 3.5 MG SUBL continuous as needed.     No current facility-administered medications on file prior to visit.    Review of Systems  Constitutional: Positive for malaise/fatigue (malaise since influenza 1 week ago). Negative for weight loss.  Respiratory: Negative for cough, hemoptysis and shortness of breath.   Cardiovascular: Positive for palpitations (occasional). Negative for chest pain, claudication and leg swelling.  Gastrointestinal: Negative for abdominal pain, blood in stool, constipation, heartburn and vomiting.  Genitourinary: Negative for dysuria.  Musculoskeletal: Negative for joint pain and myalgias.  Neurological: Negative for dizziness, focal weakness and headaches.  Endo/Heme/Allergies: Does not bruise/bleed easily.  Psychiatric/Behavioral: Negative for depression. The patient is not nervous/anxious.   All other systems reviewed and are negative.     Objective:  Blood pressure 131/78, pulse 62, height _0  (1.753 m), weight 152 lb (68.9 kg), SpO2 99 %. Body mass index is 22.45 kg/m.  Physical Exam  Constitutional: He appears well-developed and well-nourished. No distress.  HENT:  Head: Atraumatic.  Eyes: Conjunctivae are normal.  Neck: Neck supple. No JVD present. No thyromegaly present.  Cardiovascular: Normal rate, regular rhythm, normal heart sounds and intact distal pulses. Exam reveals no gallop.  No murmur heard. Pulmonary/Chest: Effort normal and breath sounds normal.  Abdominal: Soft. Bowel sounds are normal.  Musculoskeletal: Normal range of motion.        General: No edema.  Neurological: He is alert.  Skin: Skin is warm and dry.  Psychiatric: He has a normal mood and affect.    CARDIAC STUDIES:   Coronary Angiography 05/06/2018: Coronary angiogram 05/06/2018: Normal LV systolic function, normal LVEDP.  Minimal calcification in the  proximal and mid LAD, hazy 10% stenosis, IVUS reveals minimal calcification and moderate amount of soft plaque constituting about 30% stenosis.  There is no ulcerated plaque.  Slow flow evident improved with IC nitro. Circumflex coronary artery proximal to mid segment has hazy 10 to 15% stenosis.  Slow flow is evident.  Improved with NTG. RCA: Dominant.  No significant disease.  Echocardiogram 05/04/2018: 1. Left ventricle cavity is normal in size. Normal left ventricular shape. Normal global wall motion. Normal diastolic filling pattern. LVEF60%. 2. Trileaflet aortic valve with mild (Grade I) regurgitation. 3. Mild mitral valve leaflet thickening. Borderline prolapse of the mitral valve leaflets. Mild (Grade I) mitral regurgitation. 4. Mild tricuspid regurgitation. No evidence of pulmonary hypertension. 5. Occasional PVC noted during entire study.  Assessment & Recommendations:   1. Paroxysmal atrial fibrillation (HCC) - EKG 12-Lead EKG 02/15/2019: Normal sinus rhythm at rate  of 64 bpm, normal axis.  No evidence of ischemia, normal EKG.  2 PACs noted.  2. Hypercholesteremia 08/07/2018: Creatinine 0.95, EGFR 84/97, potassium 4.6, CMP normal.  Cholesterol 125, triglycerides 73, HDL 49, LDL 61.  05/03/2018: Glucose 102, creatinine 0.89, EGFR 91/105, potassium 4.8, BMP normal.  CBC normal.  Recommendation:  He is presently doing well and essentially asymptomatic, except for occasional palpitations.  Is presently tolerating metoprolol 37.5 mg p.o. b.i.d.  Continue the same.  He is on Crestor and lipids are well controlled for mild Coronary plaque.  Advised him to take extra doses of metoprolol if he has frequent palpitations related to PACs.  I'll see him back on an annual basis.  Adrian Prows, MD, [facility] 02/15/2019, 5:26 PM [facility]. [place] Pager: [phone] Office: [phone] If no answer Cell [phone]

## 2019-03-29 ENCOUNTER — Other Ambulatory Visit: Payer: Self-pay | Admitting: Cardiology

## 2019-06-24 ENCOUNTER — Other Ambulatory Visit: Payer: Self-pay

## 2019-06-24 MED ORDER — ROSUVASTATIN CALCIUM 10 MG PO TABS: 20.0000 mg | ORAL_TABLET | Freq: Every day | ORAL | 2 refills | Status: DC

## 2019-07-03 ENCOUNTER — Other Ambulatory Visit: Payer: Self-pay | Admitting: Cardiology

## 2019-09-12 ENCOUNTER — Other Ambulatory Visit: Payer: Self-pay | Admitting: Cardiology

## 2019-09-23 ENCOUNTER — Other Ambulatory Visit: Payer: Self-pay | Admitting: Cardiology

## 2019-12-08 DIAGNOSIS — H15112 Episcleritis periodica fugax, left eye: Secondary | ICD-10-CM | POA: Diagnosis not present

## 2019-12-11 ENCOUNTER — Other Ambulatory Visit: Payer: Self-pay | Admitting: Cardiology

## 2019-12-30 ENCOUNTER — Other Ambulatory Visit: Payer: Self-pay

## 2019-12-30 MED ORDER — ROSUVASTATIN CALCIUM 20 MG PO TABS: 20.0000 mg | ORAL_TABLET | Freq: Every day | ORAL | 3 refills | Status: DC

## 2019-12-30 MED ORDER — METOPROLOL SUCCINATE ER 25 MG PO TB24: 37.5000 mg | ORAL_TABLET | Freq: Every day | ORAL | 3 refills | Status: DC

## 2020-01-18 ENCOUNTER — Ambulatory Visit: Payer: Medicare Other | Admitting: Cardiology

## 2020-01-18 DIAGNOSIS — H524 Presbyopia: Secondary | ICD-10-CM | POA: Diagnosis not present

## 2020-02-01 DIAGNOSIS — I4891 Unspecified atrial fibrillation: Secondary | ICD-10-CM | POA: Diagnosis not present

## 2020-02-01 DIAGNOSIS — Z0000 Encounter for general adult medical examination without abnormal findings: Secondary | ICD-10-CM | POA: Diagnosis not present

## 2020-02-01 DIAGNOSIS — K5190 Ulcerative colitis, unspecified, without complications: Secondary | ICD-10-CM | POA: Diagnosis not present

## 2020-02-01 DIAGNOSIS — I2510 Atherosclerotic heart disease of native coronary artery without angina pectoris: Secondary | ICD-10-CM | POA: Diagnosis not present

## 2020-02-15 DIAGNOSIS — Z0120 Encounter for dental examination and cleaning without abnormal findings: Secondary | ICD-10-CM | POA: Diagnosis not present

## 2020-02-16 ENCOUNTER — Ambulatory Visit: Payer: BLUE CROSS/BLUE SHIELD | Admitting: Cardiology

## 2020-02-24 ENCOUNTER — Telehealth: Payer: Self-pay

## 2020-02-29 ENCOUNTER — Ambulatory Visit: Payer: Medicare Other | Admitting: Cardiology

## 2020-03-15 DIAGNOSIS — M1990 Unspecified osteoarthritis, unspecified site: Secondary | ICD-10-CM

## 2020-03-15 HISTORY — DX: Unspecified osteoarthritis, unspecified site: M19.90

## 2020-03-28 DIAGNOSIS — M25552 Pain in left hip: Secondary | ICD-10-CM | POA: Diagnosis not present

## 2020-04-27 DIAGNOSIS — M25552 Pain in left hip: Secondary | ICD-10-CM | POA: Insufficient documentation

## 2020-05-17 NOTE — Progress Notes (Signed)
Primary Physician/Referring:  Prince Solian, MD  Patient ID: [name], male    DOB: [birth year], 64 y.o.   MRN: [id number]  Chief Complaint  Patient presents with  . Follow-up    1 year  . Atrial Fibrillation   HPI:    [name]  is a 64 y.o. male  with mild hyperlipidemia, who swims almost every day,  treadmill exercise stress test for dyspnea on 04/30/2018 was markedly abnormal with EKG changes of ischemia with moderately reduced exercise tolerance, echocardiogram was essentially unremarkable except for mild mitral valve prolapse and MR and mild TR. He underwent cardiac catheterization on 05/06/2018 and was found to have moderate coronary artery disease involving the LAD and circumflex.    He has also had one episode of atrial fibrillation on 07/04/2018 and underwent same day direct-current cardioversion and was on anticoagulation for 2 to 3 months and was discontinued due to lack of any other risk factors.  He has been on metoprolol succinate for PACs and palpitations.  Remains asymptomatic, continues to exercise fairly regularly.  He has mild hyperlipidemia.  Presently on statin which he is tolerating.  Past Medical History:  Diagnosis Date  . Arthritis 03/2020   R hip  . Asthma   . Hypercholesteremia   . Ulcerative colitis    Past Surgical History:  Procedure Laterality Date  . LEFT HEART CATH AND CORONARY ANGIOGRAPHY N/A 05/06/2018   Procedure: LEFT HEART CATH AND CORONARY ANGIOGRAPHY;  Surgeon: Adrian Prows, MD;  Location: [facility];  Service: Cardiovascular;  Laterality: N/A;   History reviewed. No pertinent family history.  Social History   Tobacco Use  . Smoking status: Never Smoker  . Smokeless tobacco: Never Used  Substance Use Topics  . Alcohol use: No   Marital Status: Married  ROS  Review of Systems  Cardiovascular: Negative for dyspnea on exertion, leg swelling and syncope.  Musculoskeletal: Positive for arthritis (right hip).  Gastrointestinal:  Negative for melena.   Objective  Blood pressure 140/74, pulse 86, resp. rate 16, height 5' 9"  (1.753 m), weight 154 lb (69.9 kg), SpO2 98 %.  Vitals with BMI 05/18/2020 02/15/2019 01/05/2019  Height 5' 9"  5' 9"  5' 9"   Weight 154 lbs 152 lbs 150 lbs  BMI 22.73 08.65 78.46  Systolic 962 952 841  Diastolic 74 78 99  Pulse 86 62 70     Physical Exam  Constitutional: He appears well-developed and well-nourished. No distress.  Cardiovascular: Normal rate, regular rhythm and intact distal pulses. Exam reveals no gallop.  No murmur heard. No leg edema, no JVD.   Pulmonary/Chest: Effort normal and breath sounds normal. No accessory muscle usage.  Abdominal: Soft. Bowel sounds are normal.   Laboratory examination:   No results for input(s): NA, K, CL, CO2, GLUCOSE, BUN, CREATININE, CALCIUM, GFRNONAA, GFRAA in the last 8760 hours. CrCl cannot be calculated (Patient's most recent lab result is older than the maximum 21 days allowed.).  CMP Latest Ref Rng & Units 07/04/2018 01/25/2012 12/20/2007  Glucose 70 - 99 mg/dL 87 122(H) 110(H)  BUN 8 - 23 mg/dL 21 19 11   Creatinine 0.61 - 1.24 mg/dL 0.80 0.92 0.83  Sodium 135 - 145 mmol/L 141 137 138  Potassium 3.5 - 5.1 mmol/L 3.8 3.5 3.5  Chloride 98 - 111 mmol/L 106 102 104  CO2 19 - 32 mEq/L - 28 26  Calcium 8.4 - 10.5 mg/dL - 10.2 8.9  Total Protein 6.0 - 8.3 g/dL - 7.6 7.6  Total Bilirubin 0.3 - 1.2 mg/dL - 1.2 2.1(H)  Alkaline Phos 39 - 117 U/L - 66 51  AST 0 - 37 U/L - 19 20  ALT 0 - 53 U/L - 21 21   CBC Latest Ref Rng & Units 07/04/2018 01/25/2012  WBC 4.0 - 10.5 K/uL - 7.0  Hemoglobin 13.0 - 17.0 g/dL 13.9 15.2  Hematocrit 39.0 - 52.0 % 41.0 44.1  Platelets 150 - 400 K/uL - 199    Lipid Panel No results found for: CHOL No results found for: HDL No results found for: LDLCALC No results found for: TRIG No results found for: CHOLHDL No results found for: LDLDIRECT  No results found for: CHOL, TRIG, HDL, CHOLHDL, VLDL, LDLCALC,  LDLDIRECT  HEMOGLOBIN A1C No results found for: HGBA1C, MPG TSH No results for input(s): TSH in the last 8760 hours.  External labs:   Labs 03/19/2020:  Serum glucose 100 mg, BUN 13, creatinine 0.83, EGFR 92 mL, potassium 5.0.  CMP normal.  Total cholesterol 135, triglycerides 98, HDL 50, LDL 67.  TSH normal.  PSA normal.  Hb 15.8/HCT 47.9, WBC 6.8, platelets 248.  02/07/2019:   Glucose 126, BUN 20, Creatinine, Serum 1.0, EGFR 75/91, Na 137. H/H 13.9/42.4, WBC 7.43, Albumin, serum 3.8, Alk Phos 59, ALT 23, AST 27, Total Bili 2.0,   08/07/2018: Creatinine 0.95, EGFR 84/97, potassium 4.6, CMP normal.  Cholesterol 125, triglycerides 73, HDL 49, LDL 61.  05/03/2018: Glucose 102, creatinine 0.89, EGFR 91/105, potassium 4.8, BMP normal.  CBC normal.  TSH 0.900 micr 12/04/2016  Medications and allergies  No Known Allergies   Current Outpatient Medications  Medication Instructions  . celecoxib (CELEBREX) 200 mg, Oral, Daily  . Cholecalciferol (VITAMIN D3) 125 MCG (5000 UT) TABS 5,000 tablets, Oral, Twice weekly.   . fluticasone (FLONASE) 50 MCG/ACT nasal spray 1 spray, Each Nare, Daily  . Fluticasone-Salmeterol (ADVAIR DISKUS) 100-50 MCG/DOSE AEPB 1 puff, Inhalation, As needed  . mesalamine (LIALDA) 1.2 g, Oral, Daily with breakfast  . metoprolol succinate (TOPROL-XL) 37.5 mg, Oral, Daily  . nystatin cream (MYCOSTATIN) Continuous PRN  . rosuvastatin (CRESTOR) 20 mg, Oral, Daily  . Zolpidem Tartrate 3.5 MG SUBL Continuous PRN   Medications Discontinued During This Encounter  Medication Reason  . benzonatate (TESSALON) 200 MG capsule No longer needed (for PRN medications)  . diclofenac sodium (VOLTAREN) 1 % GEL No longer needed (for PRN medications)    Radiology:   No results found.  Cardiac Studies:   Echocardiogram 05/04/2018: 1. Left ventricle cavity is normal in size. Normal left ventricular shape. Normal global wall motion. Normal diastolic filling pattern.  LVEF60%. 2. Trileaflet aortic valve with mild (Grade I) regurgitation. 3. Mild mitral valve leaflet thickening. Borderline prolapse of the mitral valve leaflets. Mild (Grade I) mitral regurgitation. 4. Mild tricuspid regurgitation. No evidence of pulmonary hypertension. 5. Occasional PVC noted during entire study.  Coronary Angiography 05/06/2018: Coronary angiogram 05/06/2018: Normal LV systolic function, normal LVEDP.  Minimal calcification in the proximal and mid LAD, hazy 10% stenosis, IVUS reveals minimal calcification and moderate amount of soft plaque constituting about 30% stenosis.  There is no ulcerated plaque.  Slow flow evident improved with IC nitro. Circumflex coronary artery proximal to mid segment has hazy 10 to 15% stenosis.  Slow flow is evident.  Improved with NTG. RCA: Dominant.  No significant disease.   EKG  EKG 05/18/2020: Normal sinus rhythm at rate of 66 bpm, normal axis.  No evidence of ischemia, normal EKG.  02/15/2019: Normal sinus rhythm at rate of 64 bpm, normal axis.  No evidence of ischemia, normal EKG.  2 PACs noted.  Assessment     ICD-10-CM   1. Palpitations  R00.2 EKG 12-Lead  2. Hypercholesteremia  E78.00   3. H/O atrial fibrillation without current medication  Z86.79     Recommendations:   Aaden Buckman  is a 64 y.o. male  with mild hyperlipidemia, palpitations due to PACs, who swims almost every day,  treadmill exercise stress test for dyspnea on 04/30/2018 was markedly abnormal with EKG changes of ischemia with moderately reduced exercise tolerance, echocardiogram was essentially unremarkable except for mild mitral valve prolapse and MR and mild TR. He underwent cardiac catheterization on 05/06/2018 and was found to have moderate coronary artery disease involving the LAD and circumflex.    He has also had one episode of atrial fibrillation on 07/04/2018 and underwent same day direct-current cardioversion and was on anticoagulation for 2 to 3 months  and was discontinued due to lack of any other risk factors.  He is presently doing well and essentially asymptomatic, except for occasional palpitations.  Is presently tolerating metoprolol 37.5 mg p.o. b.i.d.  Continue the same.  He is on Crestor and lipids are well controlled for mild Coronary plaque.  Advised him to take extra doses of metoprolol if he has frequent palpitations related to PACs.  I'll see him back on an annual basis.  Adrian Prows, MD, [facility] 05/19/2020, 6:44 PM [facility]. [place] Pager: [phone] Office: [phone]

## 2020-05-18 ENCOUNTER — Encounter: Payer: Self-pay | Admitting: Cardiology

## 2020-05-18 ENCOUNTER — Other Ambulatory Visit: Payer: Self-pay

## 2020-05-18 ENCOUNTER — Ambulatory Visit: Payer: Medicare Other | Admitting: Cardiology

## 2020-05-18 VITALS — BP 140/74 | HR 86 | Resp 16 | Ht 69.0 in | Wt 154.0 lb

## 2020-05-18 DIAGNOSIS — Z8679 Personal history of other diseases of the circulatory system: Secondary | ICD-10-CM | POA: Diagnosis not present

## 2020-05-18 DIAGNOSIS — E7800 Pure hypercholesterolemia, unspecified: Secondary | ICD-10-CM

## 2020-05-18 DIAGNOSIS — R002 Palpitations: Secondary | ICD-10-CM | POA: Diagnosis not present

## 2020-07-24 ENCOUNTER — Other Ambulatory Visit (HOSPITAL_COMMUNITY): Payer: Self-pay | Admitting: Internal Medicine

## 2020-08-24 ENCOUNTER — Other Ambulatory Visit (HOSPITAL_COMMUNITY): Payer: Self-pay | Admitting: Orthopedic Surgery

## 2020-08-31 DIAGNOSIS — R1032 Left lower quadrant pain: Secondary | ICD-10-CM | POA: Diagnosis not present

## 2020-09-12 DIAGNOSIS — Z0120 Encounter for dental examination and cleaning without abnormal findings: Secondary | ICD-10-CM | POA: Diagnosis not present

## 2020-12-18 ENCOUNTER — Other Ambulatory Visit (HOSPITAL_COMMUNITY): Payer: Self-pay | Admitting: Internal Medicine

## 2020-12-19 ENCOUNTER — Other Ambulatory Visit (HOSPITAL_COMMUNITY): Payer: Self-pay | Admitting: Internal Medicine

## 2020-12-25 ENCOUNTER — Other Ambulatory Visit (HOSPITAL_COMMUNITY): Payer: Self-pay | Admitting: Internal Medicine

## 2021-02-27 ENCOUNTER — Other Ambulatory Visit (HOSPITAL_COMMUNITY): Payer: Self-pay | Admitting: Internal Medicine

## 2021-04-02 ENCOUNTER — Other Ambulatory Visit (HOSPITAL_COMMUNITY): Payer: Self-pay

## 2021-04-02 MED ORDER — ZOLPIDEM TARTRATE 1.75 MG SL SUBL
1.0000 | SUBLINGUAL_TABLET | Freq: Every evening | SUBLINGUAL | 0 refills | Status: DC
  Filled 2021-04-02: qty 10, 10d supply, fill #0

## 2021-04-03 ENCOUNTER — Other Ambulatory Visit (HOSPITAL_COMMUNITY): Payer: Self-pay

## 2021-04-08 ENCOUNTER — Other Ambulatory Visit (HOSPITAL_COMMUNITY): Payer: Self-pay

## 2021-04-08 MED ORDER — NYSTATIN 100000 UNIT/GM EX CREA
TOPICAL_CREAM | CUTANEOUS | 0 refills | Status: DC
  Filled 2021-04-08: qty 30, 15d supply, fill #0

## 2021-04-22 ENCOUNTER — Other Ambulatory Visit (HOSPITAL_COMMUNITY): Payer: Self-pay

## 2021-04-25 ENCOUNTER — Other Ambulatory Visit (HOSPITAL_COMMUNITY): Payer: Self-pay

## 2021-04-25 MED ORDER — ROSUVASTATIN CALCIUM 20 MG PO TABS
20.0000 mg | ORAL_TABLET | Freq: Every day | ORAL | 3 refills | Status: DC
  Filled 2021-04-25: qty 90, 90d supply, fill #0
  Filled 2021-06-18: qty 90, 90d supply, fill #1
  Filled 2021-10-28: qty 90, 90d supply, fill #2
  Filled 2021-10-28: qty 90, 90d supply, fill #0
  Filled 2022-02-15: qty 90, 90d supply, fill #1

## 2021-04-26 ENCOUNTER — Other Ambulatory Visit (HOSPITAL_COMMUNITY): Payer: Self-pay

## 2021-04-26 DIAGNOSIS — Z125 Encounter for screening for malignant neoplasm of prostate: Secondary | ICD-10-CM | POA: Diagnosis not present

## 2021-04-26 DIAGNOSIS — E785 Hyperlipidemia, unspecified: Secondary | ICD-10-CM | POA: Diagnosis not present

## 2021-05-03 DIAGNOSIS — E785 Hyperlipidemia, unspecified: Secondary | ICD-10-CM | POA: Diagnosis not present

## 2021-05-03 DIAGNOSIS — I2510 Atherosclerotic heart disease of native coronary artery without angina pectoris: Secondary | ICD-10-CM | POA: Diagnosis not present

## 2021-05-03 DIAGNOSIS — Z0000 Encounter for general adult medical examination without abnormal findings: Secondary | ICD-10-CM | POA: Diagnosis not present

## 2021-05-03 DIAGNOSIS — K5190 Ulcerative colitis, unspecified, without complications: Secondary | ICD-10-CM | POA: Diagnosis not present

## 2021-05-03 DIAGNOSIS — I4891 Unspecified atrial fibrillation: Secondary | ICD-10-CM | POA: Diagnosis not present

## 2021-05-03 DIAGNOSIS — R5383 Other fatigue: Secondary | ICD-10-CM | POA: Diagnosis not present

## 2021-05-03 DIAGNOSIS — R972 Elevated prostate specific antigen [PSA]: Secondary | ICD-10-CM | POA: Diagnosis not present

## 2021-05-08 ENCOUNTER — Ambulatory Visit: Payer: BLUE CROSS/BLUE SHIELD

## 2021-05-10 ENCOUNTER — Other Ambulatory Visit (HOSPITAL_COMMUNITY): Payer: Self-pay

## 2021-05-10 DIAGNOSIS — M5410 Radiculopathy, site unspecified: Secondary | ICD-10-CM | POA: Diagnosis not present

## 2021-05-10 DIAGNOSIS — R82998 Other abnormal findings in urine: Secondary | ICD-10-CM | POA: Diagnosis not present

## 2021-05-10 DIAGNOSIS — R972 Elevated prostate specific antigen [PSA]: Secondary | ICD-10-CM | POA: Diagnosis not present

## 2021-05-10 MED ORDER — PREGABALIN 25 MG PO CAPS
ORAL_CAPSULE | ORAL | 2 refills | Status: DC
  Filled 2021-05-10: qty 60, 30d supply, fill #0
  Filled 2021-11-20: qty 60, 30d supply, fill #1

## 2021-05-14 ENCOUNTER — Other Ambulatory Visit (HOSPITAL_COMMUNITY): Payer: Self-pay

## 2021-05-15 ENCOUNTER — Ambulatory Visit: Payer: BLUE CROSS/BLUE SHIELD | Attending: Internal Medicine

## 2021-05-15 DIAGNOSIS — Z23 Encounter for immunization: Secondary | ICD-10-CM

## 2021-05-15 NOTE — Progress Notes (Signed)
   Covid-19 Vaccination Clinic  Name:  Daymian Lill    MRN: [id number] DOB: [birth year]  05/15/2021  Mr. Enriques was observed post Covid-19 immunization for 15 minutes without incident. He was provided with Vaccine Information Sheet and instruction to access the V-Safe system.   Mr. Seehafer was instructed to call 911 with any severe reactions post vaccine: Marland Kitchen Difficulty breathing  . Swelling of face and throat  . A fast heartbeat  . A bad rash all over body  . Dizziness and weakness   Immunizations Administered    Name Date Dose VIS Date Route   Moderna Covid-19 Booster Vaccine 05/15/2021 11:29 AM 0.25 mL 10/03/2020 Intramuscular   Manufacturer: Moderna   Lot: [id number]   [place]: [id number]

## 2021-05-17 ENCOUNTER — Other Ambulatory Visit (HOSPITAL_COMMUNITY): Payer: Self-pay

## 2021-05-17 MED ORDER — COVID-19 MRNA VACC (MODERNA) 100 MCG/0.5ML IM SUSP
INTRAMUSCULAR | 0 refills | Status: DC
  Filled 2021-05-17: qty 0.5, 17d supply, fill #0

## 2021-05-22 ENCOUNTER — Other Ambulatory Visit (HOSPITAL_COMMUNITY): Payer: Self-pay

## 2021-05-28 ENCOUNTER — Other Ambulatory Visit (HOSPITAL_COMMUNITY): Payer: Self-pay

## 2021-05-28 MED FILL — Celecoxib Cap 200 MG: ORAL | 90 days supply | Qty: 90 | Fill #0 | Status: AC

## 2021-05-28 MED FILL — Fluticasone-Salmeterol Aer Powder BA 100-50 MCG/ACT: RESPIRATORY_TRACT | 60 days supply | Qty: 60 | Fill #0 | Status: AC

## 2021-05-30 ENCOUNTER — Other Ambulatory Visit (HOSPITAL_COMMUNITY): Payer: Self-pay

## 2021-06-13 ENCOUNTER — Other Ambulatory Visit (HOSPITAL_COMMUNITY): Payer: Self-pay

## 2021-06-13 MED ORDER — TETANUS-DIPHTH-ACELL PERTUSSIS 5-2.5-18.5 LF-MCG/0.5 IM SUSY
PREFILLED_SYRINGE | INTRAMUSCULAR | 0 refills | Status: DC
  Filled 2021-06-13: qty 0.5, 1d supply, fill #0

## 2021-06-14 DIAGNOSIS — H5213 Myopia, bilateral: Secondary | ICD-10-CM | POA: Diagnosis not present

## 2021-06-18 ENCOUNTER — Other Ambulatory Visit (HOSPITAL_COMMUNITY): Payer: Self-pay

## 2021-06-18 MED FILL — Mesalamine Tab Delayed Release 1.2 GM: ORAL | 90 days supply | Qty: 90 | Fill #0 | Status: AC

## 2021-06-18 MED FILL — Metoprolol Succinate Tab ER 24HR 25 MG (Tartrate Equiv): ORAL | 90 days supply | Qty: 135 | Fill #0 | Status: AC

## 2021-06-19 DIAGNOSIS — M545 Low back pain, unspecified: Secondary | ICD-10-CM | POA: Insufficient documentation

## 2021-06-19 DIAGNOSIS — M25551 Pain in right hip: Secondary | ICD-10-CM | POA: Diagnosis not present

## 2021-06-26 ENCOUNTER — Other Ambulatory Visit (HOSPITAL_COMMUNITY): Payer: Self-pay

## 2021-06-26 DIAGNOSIS — R972 Elevated prostate specific antigen [PSA]: Secondary | ICD-10-CM | POA: Diagnosis not present

## 2021-06-26 MED ORDER — LEVOFLOXACIN 750 MG PO TABS
ORAL_TABLET | ORAL | 0 refills | Status: DC
  Filled 2021-06-26: qty 1, 1d supply, fill #0

## 2021-06-27 ENCOUNTER — Other Ambulatory Visit (HOSPITAL_COMMUNITY): Payer: Self-pay

## 2021-07-05 DIAGNOSIS — R972 Elevated prostate specific antigen [PSA]: Secondary | ICD-10-CM | POA: Diagnosis not present

## 2021-07-05 DIAGNOSIS — N400 Enlarged prostate without lower urinary tract symptoms: Secondary | ICD-10-CM | POA: Diagnosis not present

## 2021-07-16 DIAGNOSIS — H5310 Unspecified subjective visual disturbances: Secondary | ICD-10-CM | POA: Diagnosis not present

## 2021-07-23 ENCOUNTER — Other Ambulatory Visit (HOSPITAL_COMMUNITY): Payer: Self-pay

## 2021-09-17 ENCOUNTER — Other Ambulatory Visit (HOSPITAL_COMMUNITY): Payer: Self-pay

## 2021-09-17 MED FILL — Mesalamine Tab Delayed Release 1.2 GM: ORAL | 90 days supply | Qty: 90 | Fill #1 | Status: CN

## 2021-09-17 MED FILL — Mesalamine Tab Delayed Release 1.2 GM: ORAL | 90 days supply | Qty: 90 | Fill #0 | Status: AC

## 2021-09-18 ENCOUNTER — Other Ambulatory Visit (HOSPITAL_COMMUNITY): Payer: Self-pay

## 2021-09-19 ENCOUNTER — Other Ambulatory Visit (HOSPITAL_COMMUNITY): Payer: Self-pay

## 2021-09-20 ENCOUNTER — Other Ambulatory Visit (HOSPITAL_COMMUNITY): Payer: Self-pay

## 2021-09-20 MED ORDER — CELECOXIB 200 MG PO CAPS
200.0000 mg | ORAL_CAPSULE | Freq: Every day | ORAL | 2 refills | Status: DC
  Filled 2021-09-20: qty 90, 90d supply, fill #0
  Filled 2022-01-08: qty 90, 90d supply, fill #1
  Filled 2022-01-09: qty 90, 90d supply, fill #0
  Filled 2022-04-21: qty 90, 90d supply, fill #1

## 2021-09-23 ENCOUNTER — Other Ambulatory Visit (HOSPITAL_COMMUNITY): Payer: Self-pay

## 2021-09-23 MED ORDER — METOPROLOL SUCCINATE ER 25 MG PO TB24
37.5000 mg | ORAL_TABLET | Freq: Every day | ORAL | 3 refills | Status: DC
  Filled 2021-09-23 (×2): qty 135, 90d supply, fill #0
  Filled 2021-12-23: qty 135, 90d supply, fill #1
  Filled 2022-03-24: qty 135, 90d supply, fill #2

## 2021-09-24 ENCOUNTER — Other Ambulatory Visit (HOSPITAL_COMMUNITY): Payer: Self-pay

## 2021-10-02 DIAGNOSIS — R972 Elevated prostate specific antigen [PSA]: Secondary | ICD-10-CM | POA: Diagnosis not present

## 2021-10-08 DIAGNOSIS — R972 Elevated prostate specific antigen [PSA]: Secondary | ICD-10-CM | POA: Insufficient documentation

## 2021-10-10 DIAGNOSIS — R972 Elevated prostate specific antigen [PSA]: Secondary | ICD-10-CM | POA: Diagnosis not present

## 2021-10-22 ENCOUNTER — Other Ambulatory Visit (HOSPITAL_COMMUNITY): Payer: Self-pay

## 2021-10-22 DIAGNOSIS — M542 Cervicalgia: Secondary | ICD-10-CM | POA: Insufficient documentation

## 2021-10-22 MED ORDER — PREDNISONE 10 MG PO TABS
ORAL_TABLET | ORAL | 0 refills | Status: AC
  Filled 2021-10-22: qty 21, 12d supply, fill #0

## 2021-10-23 ENCOUNTER — Other Ambulatory Visit (HOSPITAL_COMMUNITY): Payer: Self-pay

## 2021-10-23 MED ORDER — ZOLPIDEM TARTRATE 1.75 MG SL SUBL
1.0000 | SUBLINGUAL_TABLET | SUBLINGUAL | 0 refills | Status: DC
  Filled 2021-10-23: qty 10, 10d supply, fill #0

## 2021-10-28 ENCOUNTER — Other Ambulatory Visit (HOSPITAL_COMMUNITY): Payer: Self-pay

## 2021-10-31 ENCOUNTER — Other Ambulatory Visit (HOSPITAL_COMMUNITY): Payer: Self-pay

## 2021-10-31 MED ORDER — METHOCARBAMOL 500 MG PO TABS
500.0000 mg | ORAL_TABLET | Freq: Three times a day (TID) | ORAL | 1 refills | Status: DC
  Filled 2021-10-31 (×2): qty 90, 30d supply, fill #0

## 2021-11-11 DIAGNOSIS — R631 Polydipsia: Secondary | ICD-10-CM | POA: Diagnosis not present

## 2021-11-11 DIAGNOSIS — R5383 Other fatigue: Secondary | ICD-10-CM | POA: Diagnosis not present

## 2021-11-13 DIAGNOSIS — M25552 Pain in left hip: Secondary | ICD-10-CM | POA: Diagnosis not present

## 2021-11-21 ENCOUNTER — Encounter (HOSPITAL_BASED_OUTPATIENT_CLINIC_OR_DEPARTMENT_OTHER): Payer: BLUE CROSS/BLUE SHIELD | Admitting: Physical Therapy

## 2021-11-22 ENCOUNTER — Other Ambulatory Visit (HOSPITAL_COMMUNITY): Payer: Self-pay

## 2021-11-22 MED ORDER — PREGABALIN 25 MG PO CAPS
25.0000 mg | ORAL_CAPSULE | Freq: Two times a day (BID) | ORAL | 2 refills | Status: DC
  Filled 2021-11-22: qty 60, 30d supply, fill #0
  Filled 2022-08-15: qty 60, 30d supply, fill #1

## 2021-11-26 DIAGNOSIS — H903 Sensorineural hearing loss, bilateral: Secondary | ICD-10-CM | POA: Diagnosis not present

## 2021-12-16 ENCOUNTER — Other Ambulatory Visit (HOSPITAL_COMMUNITY): Payer: Self-pay

## 2021-12-17 ENCOUNTER — Other Ambulatory Visit (HOSPITAL_COMMUNITY): Payer: Self-pay

## 2021-12-18 ENCOUNTER — Other Ambulatory Visit (HOSPITAL_COMMUNITY): Payer: Self-pay

## 2021-12-18 MED ORDER — FLUTICASONE-SALMETEROL 100-50 MCG/ACT IN AEPB
1.0000 | INHALATION_SPRAY | Freq: Every day | RESPIRATORY_TRACT | 6 refills | Status: DC
  Filled 2021-12-18: qty 60, 30d supply, fill #0
  Filled 2022-03-04 – 2022-05-19 (×2): qty 60, 30d supply, fill #1
  Filled 2022-08-04: qty 60, 30d supply, fill #2
  Filled 2022-10-15: qty 60, 30d supply, fill #3
  Filled 2022-12-05 (×2): qty 60, 30d supply, fill #4

## 2021-12-23 ENCOUNTER — Other Ambulatory Visit (HOSPITAL_COMMUNITY): Payer: Self-pay

## 2021-12-23 MED ORDER — LIALDA 1.2 G PO TBEC
1.2000 g | DELAYED_RELEASE_TABLET | Freq: Every day | ORAL | 3 refills | Status: DC
  Filled 2021-12-23: qty 90, 90d supply, fill #0

## 2021-12-23 MED ORDER — DICLOFENAC SODIUM 1 % EX GEL
2.0000 g | Freq: Four times a day (QID) | CUTANEOUS | 5 refills | Status: DC
Start: 2021-12-23 — End: 2023-07-20
  Filled 2021-12-23: qty 100, 13d supply, fill #0
  Filled 2022-08-04: qty 100, 13d supply, fill #1
  Filled 2022-12-05 – 2022-12-09 (×2): qty 100, 13d supply, fill #2

## 2021-12-24 ENCOUNTER — Other Ambulatory Visit (HOSPITAL_COMMUNITY): Payer: Self-pay

## 2021-12-25 ENCOUNTER — Other Ambulatory Visit (HOSPITAL_COMMUNITY): Payer: Self-pay

## 2022-01-09 ENCOUNTER — Other Ambulatory Visit (HOSPITAL_COMMUNITY): Payer: Self-pay

## 2022-01-16 ENCOUNTER — Other Ambulatory Visit (HOSPITAL_COMMUNITY): Payer: Self-pay

## 2022-01-16 MED ORDER — PREDNISONE 10 MG PO TABS
ORAL_TABLET | ORAL | 0 refills | Status: AC
  Filled 2022-01-16: qty 21, 12d supply, fill #0

## 2022-01-31 DIAGNOSIS — R972 Elevated prostate specific antigen [PSA]: Secondary | ICD-10-CM | POA: Diagnosis not present

## 2022-02-15 ENCOUNTER — Other Ambulatory Visit (HOSPITAL_COMMUNITY): Payer: Self-pay

## 2022-03-04 ENCOUNTER — Other Ambulatory Visit (HOSPITAL_COMMUNITY): Payer: Self-pay

## 2022-03-04 MED ORDER — HYDROCODONE BIT-HOMATROP MBR 5-1.5 MG/5ML PO SOLN
5.0000 mL | Freq: Four times a day (QID) | ORAL | 0 refills | Status: DC | PRN
Start: 2022-03-04 — End: 2022-11-13
  Filled 2022-03-04: qty 180, 9d supply, fill #0

## 2022-03-04 MED ORDER — BENZONATATE 200 MG PO CAPS
200.0000 mg | ORAL_CAPSULE | Freq: Three times a day (TID) | ORAL | 1 refills | Status: DC | PRN
  Filled 2022-03-04: qty 30, 10d supply, fill #0

## 2022-03-04 MED ORDER — ALBUTEROL SULFATE HFA 108 (90 BASE) MCG/ACT IN AERS
2.0000 | INHALATION_SPRAY | RESPIRATORY_TRACT | 3 refills | Status: DC | PRN
  Filled 2022-03-04: qty 18, 17d supply, fill #0

## 2022-03-17 ENCOUNTER — Other Ambulatory Visit (HOSPITAL_COMMUNITY): Payer: Self-pay

## 2022-03-17 MED ORDER — ZOLPIDEM TARTRATE 1.75 MG SL SUBL
1.7500 mg | SUBLINGUAL_TABLET | Freq: Every evening | SUBLINGUAL | 0 refills | Status: DC
  Filled 2022-03-17: qty 10, 10d supply, fill #0

## 2022-03-24 ENCOUNTER — Other Ambulatory Visit (HOSPITAL_COMMUNITY): Payer: Self-pay

## 2022-04-15 DIAGNOSIS — I2510 Atherosclerotic heart disease of native coronary artery without angina pectoris: Secondary | ICD-10-CM | POA: Diagnosis not present

## 2022-04-15 DIAGNOSIS — E785 Hyperlipidemia, unspecified: Secondary | ICD-10-CM | POA: Diagnosis not present

## 2022-04-15 DIAGNOSIS — R5383 Other fatigue: Secondary | ICD-10-CM | POA: Diagnosis not present

## 2022-04-16 DIAGNOSIS — M5410 Radiculopathy, site unspecified: Secondary | ICD-10-CM | POA: Diagnosis not present

## 2022-04-16 DIAGNOSIS — R3589 Other polyuria: Secondary | ICD-10-CM | POA: Diagnosis not present

## 2022-04-16 DIAGNOSIS — R972 Elevated prostate specific antigen [PSA]: Secondary | ICD-10-CM | POA: Diagnosis not present

## 2022-04-22 ENCOUNTER — Other Ambulatory Visit (HOSPITAL_COMMUNITY): Payer: Self-pay

## 2022-04-24 ENCOUNTER — Other Ambulatory Visit: Payer: Self-pay | Admitting: Internal Medicine

## 2022-04-24 DIAGNOSIS — R3589 Other polyuria: Secondary | ICD-10-CM

## 2022-04-24 DIAGNOSIS — R5383 Other fatigue: Secondary | ICD-10-CM

## 2022-04-30 DIAGNOSIS — M542 Cervicalgia: Secondary | ICD-10-CM | POA: Diagnosis not present

## 2022-05-05 ENCOUNTER — Other Ambulatory Visit: Payer: BLUE CROSS/BLUE SHIELD

## 2022-05-07 ENCOUNTER — Ambulatory Visit
Admission: RE | Admit: 2022-05-07 | Discharge: 2022-05-07 | Disposition: A | Discharge status: Home / Self Care | Payer: 59 | Source: Ambulatory Visit | Attending: Internal Medicine | Admitting: Internal Medicine

## 2022-05-07 DIAGNOSIS — R3589 Other polyuria: Secondary | ICD-10-CM | POA: Diagnosis not present

## 2022-05-07 DIAGNOSIS — R5383 Other fatigue: Secondary | ICD-10-CM

## 2022-05-07 DIAGNOSIS — J320 Chronic maxillary sinusitis: Secondary | ICD-10-CM | POA: Diagnosis not present

## 2022-05-07 MED ORDER — GADOBENATE DIMEGLUMINE 529 MG/ML IV SOLN
7.0000 mL | Freq: Once | INTRAVENOUS | Status: AC | PRN
  Administered 2022-05-07: 7 mL via INTRAVENOUS

## 2022-05-08 ENCOUNTER — Other Ambulatory Visit (HOSPITAL_COMMUNITY): Payer: Self-pay

## 2022-05-08 MED ORDER — AZITHROMYCIN 250 MG PO TABS
ORAL_TABLET | ORAL | 0 refills | Status: AC
  Filled 2022-05-08: qty 6, 5d supply, fill #0

## 2022-05-13 ENCOUNTER — Other Ambulatory Visit: Payer: BLUE CROSS/BLUE SHIELD

## 2022-05-16 DIAGNOSIS — D352 Benign neoplasm of pituitary gland: Secondary | ICD-10-CM | POA: Diagnosis not present

## 2022-05-16 DIAGNOSIS — G930 Cerebral cysts: Secondary | ICD-10-CM | POA: Diagnosis not present

## 2022-05-16 DIAGNOSIS — Z1339 Encounter for screening examination for other mental health and behavioral disorders: Secondary | ICD-10-CM | POA: Diagnosis not present

## 2022-05-19 ENCOUNTER — Other Ambulatory Visit (HOSPITAL_COMMUNITY): Payer: Self-pay

## 2022-05-19 DIAGNOSIS — Z0000 Encounter for general adult medical examination without abnormal findings: Secondary | ICD-10-CM | POA: Diagnosis not present

## 2022-05-19 DIAGNOSIS — G930 Cerebral cysts: Secondary | ICD-10-CM | POA: Diagnosis not present

## 2022-05-19 DIAGNOSIS — R7989 Other specified abnormal findings of blood chemistry: Secondary | ICD-10-CM | POA: Diagnosis not present

## 2022-05-19 DIAGNOSIS — R5383 Other fatigue: Secondary | ICD-10-CM | POA: Diagnosis not present

## 2022-05-19 DIAGNOSIS — E785 Hyperlipidemia, unspecified: Secondary | ICD-10-CM | POA: Diagnosis not present

## 2022-05-22 ENCOUNTER — Other Ambulatory Visit (HOSPITAL_COMMUNITY): Payer: Self-pay

## 2022-05-26 DIAGNOSIS — M25562 Pain in left knee: Secondary | ICD-10-CM | POA: Diagnosis not present

## 2022-05-28 ENCOUNTER — Other Ambulatory Visit (HOSPITAL_COMMUNITY): Payer: Self-pay

## 2022-05-29 ENCOUNTER — Other Ambulatory Visit (HOSPITAL_COMMUNITY): Payer: Self-pay

## 2022-05-29 MED ORDER — ROSUVASTATIN CALCIUM 20 MG PO TABS
20.0000 mg | ORAL_TABLET | Freq: Every day | ORAL | 4 refills | Status: DC
  Filled 2022-05-29: qty 90, 90d supply, fill #0
  Filled 2022-08-23: qty 90, 90d supply, fill #1
  Filled 2022-12-16: qty 90, 90d supply, fill #2
  Filled 2023-04-05: qty 90, 90d supply, fill #3

## 2022-06-04 DIAGNOSIS — D352 Benign neoplasm of pituitary gland: Secondary | ICD-10-CM | POA: Diagnosis not present

## 2022-06-04 DIAGNOSIS — G930 Cerebral cysts: Secondary | ICD-10-CM | POA: Diagnosis not present

## 2022-06-11 DIAGNOSIS — R3589 Other polyuria: Secondary | ICD-10-CM | POA: Diagnosis not present

## 2022-06-11 DIAGNOSIS — R972 Elevated prostate specific antigen [PSA]: Secondary | ICD-10-CM | POA: Diagnosis not present

## 2022-06-11 DIAGNOSIS — I2510 Atherosclerotic heart disease of native coronary artery without angina pectoris: Secondary | ICD-10-CM | POA: Diagnosis not present

## 2022-06-11 DIAGNOSIS — E785 Hyperlipidemia, unspecified: Secondary | ICD-10-CM | POA: Diagnosis not present

## 2022-06-11 DIAGNOSIS — Z1339 Encounter for screening examination for other mental health and behavioral disorders: Secondary | ICD-10-CM | POA: Diagnosis not present

## 2022-06-11 DIAGNOSIS — Z0000 Encounter for general adult medical examination without abnormal findings: Secondary | ICD-10-CM | POA: Diagnosis not present

## 2022-06-11 DIAGNOSIS — D352 Benign neoplasm of pituitary gland: Secondary | ICD-10-CM | POA: Diagnosis not present

## 2022-06-11 DIAGNOSIS — R82998 Other abnormal findings in urine: Secondary | ICD-10-CM | POA: Diagnosis not present

## 2022-06-11 DIAGNOSIS — Z23 Encounter for immunization: Secondary | ICD-10-CM | POA: Diagnosis not present

## 2022-06-11 DIAGNOSIS — Z1331 Encounter for screening for depression: Secondary | ICD-10-CM | POA: Diagnosis not present

## 2022-06-11 DIAGNOSIS — M5410 Radiculopathy, site unspecified: Secondary | ICD-10-CM | POA: Diagnosis not present

## 2022-06-11 DIAGNOSIS — K5190 Ulcerative colitis, unspecified, without complications: Secondary | ICD-10-CM | POA: Diagnosis not present

## 2022-06-11 DIAGNOSIS — G930 Cerebral cysts: Secondary | ICD-10-CM | POA: Diagnosis not present

## 2022-06-16 ENCOUNTER — Other Ambulatory Visit (HOSPITAL_COMMUNITY): Payer: Self-pay

## 2022-06-18 ENCOUNTER — Other Ambulatory Visit (HOSPITAL_COMMUNITY): Payer: Self-pay

## 2022-06-18 MED ORDER — METOPROLOL SUCCINATE ER 25 MG PO TB24
37.5000 mg | ORAL_TABLET | Freq: Every day | ORAL | 3 refills | Status: DC
  Filled 2022-06-18 (×2): qty 135, 90d supply, fill #0
  Filled 2022-09-20: qty 135, 90d supply, fill #1
  Filled 2022-12-16 – 2022-12-18 (×2): qty 135, 90d supply, fill #2
  Filled 2023-03-17: qty 135, 90d supply, fill #3

## 2022-06-18 MED ORDER — ZOLPIDEM TARTRATE 1.75 MG SL SUBL
1.0000 | SUBLINGUAL_TABLET | Freq: Every day | SUBLINGUAL | 0 refills | Status: DC
Start: 2022-06-18 — End: 2022-10-15
  Filled 2022-06-18 (×2): qty 10, 10d supply, fill #0

## 2022-06-21 ENCOUNTER — Other Ambulatory Visit (HOSPITAL_COMMUNITY): Payer: Self-pay

## 2022-06-23 DIAGNOSIS — Z0000 Encounter for general adult medical examination without abnormal findings: Secondary | ICD-10-CM | POA: Diagnosis not present

## 2022-07-09 ENCOUNTER — Other Ambulatory Visit (HOSPITAL_COMMUNITY): Payer: Self-pay

## 2022-07-09 MED ORDER — MESALAMINE 1.2 G PO TBEC
1.2000 g | DELAYED_RELEASE_TABLET | Freq: Every day | ORAL | 4 refills | Status: DC
  Filled 2022-07-09: qty 90, 90d supply, fill #0
  Filled 2022-10-08: qty 30, 30d supply, fill #0

## 2022-07-29 DIAGNOSIS — H524 Presbyopia: Secondary | ICD-10-CM | POA: Diagnosis not present

## 2022-07-29 DIAGNOSIS — H52201 Unspecified astigmatism, right eye: Secondary | ICD-10-CM | POA: Diagnosis not present

## 2022-07-29 DIAGNOSIS — H5213 Myopia, bilateral: Secondary | ICD-10-CM | POA: Diagnosis not present

## 2022-08-04 ENCOUNTER — Other Ambulatory Visit (HOSPITAL_COMMUNITY): Payer: Self-pay

## 2022-08-05 ENCOUNTER — Other Ambulatory Visit (HOSPITAL_COMMUNITY): Payer: Self-pay

## 2022-08-05 MED ORDER — CELECOXIB 200 MG PO CAPS
200.0000 mg | ORAL_CAPSULE | Freq: Every day | ORAL | 2 refills | Status: DC
Start: 2022-08-05 — End: 2022-10-15
  Filled 2022-08-05: qty 31, 31d supply, fill #0
  Filled 2022-08-05: qty 59, 59d supply, fill #0
  Filled 2022-08-05: qty 90, 90d supply, fill #0

## 2022-08-06 ENCOUNTER — Other Ambulatory Visit (HOSPITAL_COMMUNITY): Payer: Self-pay

## 2022-08-06 MED ORDER — PREDNISONE 10 MG PO TABS
ORAL_TABLET | ORAL | 0 refills | Status: AC
  Filled 2022-08-06: qty 21, 12d supply, fill #0

## 2022-08-08 ENCOUNTER — Encounter: Payer: Self-pay | Admitting: Internal Medicine

## 2022-08-15 ENCOUNTER — Other Ambulatory Visit (HOSPITAL_COMMUNITY): Payer: Self-pay

## 2022-08-20 ENCOUNTER — Other Ambulatory Visit (HOSPITAL_COMMUNITY): Payer: Self-pay

## 2022-08-20 MED ORDER — MELOXICAM 15 MG PO TABS
15.0000 mg | ORAL_TABLET | Freq: Every day | ORAL | 0 refills | Status: DC
  Filled 2022-08-20: qty 30, 30d supply, fill #0

## 2022-08-23 ENCOUNTER — Other Ambulatory Visit (HOSPITAL_COMMUNITY): Payer: Self-pay

## 2022-08-23 DIAGNOSIS — M542 Cervicalgia: Secondary | ICD-10-CM | POA: Diagnosis not present

## 2022-08-26 ENCOUNTER — Other Ambulatory Visit (HOSPITAL_COMMUNITY): Payer: Self-pay

## 2022-08-26 MED ORDER — PREDNISONE 10 MG PO TABS
ORAL_TABLET | ORAL | 1 refills | Status: AC
  Filled 2022-08-26: qty 21, 12d supply, fill #0

## 2022-08-26 MED ORDER — METHOCARBAMOL 500 MG PO TABS
500.0000 mg | ORAL_TABLET | Freq: Three times a day (TID) | ORAL | 1 refills | Status: AC
  Filled 2022-08-26: qty 90, 30d supply, fill #0
  Filled 2023-07-20: qty 90, 30d supply, fill #1

## 2022-09-12 DIAGNOSIS — Z86010 Personal history of colonic polyps: Secondary | ICD-10-CM

## 2022-09-22 ENCOUNTER — Other Ambulatory Visit (HOSPITAL_COMMUNITY): Payer: Self-pay

## 2022-10-06 ENCOUNTER — Encounter: Payer: Self-pay | Admitting: *Deleted

## 2022-10-08 ENCOUNTER — Other Ambulatory Visit: Payer: Self-pay

## 2022-10-09 ENCOUNTER — Other Ambulatory Visit: Payer: Self-pay

## 2022-10-10 ENCOUNTER — Other Ambulatory Visit (HOSPITAL_COMMUNITY): Payer: Self-pay

## 2022-10-10 ENCOUNTER — Other Ambulatory Visit: Payer: Self-pay

## 2022-10-10 MED ORDER — AZITHROMYCIN 250 MG PO TABS
ORAL_TABLET | ORAL | 0 refills | Status: AC
  Filled 2022-10-10: qty 6, 5d supply, fill #0

## 2022-10-15 ENCOUNTER — Encounter: Payer: Self-pay | Admitting: Internal Medicine

## 2022-10-15 ENCOUNTER — Other Ambulatory Visit (HOSPITAL_COMMUNITY): Payer: Self-pay

## 2022-10-15 ENCOUNTER — Ambulatory Visit (INDEPENDENT_AMBULATORY_CARE_PROVIDER_SITE_OTHER): Payer: 59 | Admitting: Internal Medicine

## 2022-10-15 VITALS — BP 122/68 | HR 66 | Ht 69.0 in | Wt 154.1 lb

## 2022-10-15 DIAGNOSIS — K5130 Ulcerative (chronic) rectosigmoiditis without complications: Secondary | ICD-10-CM | POA: Diagnosis not present

## 2022-10-15 MED ORDER — NA SULFATE-K SULFATE-MG SULF 17.5-3.13-1.6 GM/177ML PO SOLN
ORAL | 0 refills | Status: DC
  Filled 2022-10-15: qty 354, 1d supply, fill #0

## 2022-10-15 NOTE — Patient Instructions (Addendum)
_______________________________________________________  If you are age 68 or older, your body mass index should be between 23-30. Your Body mass index is 22.76 kg/m. If this is out of the aforementioned range listed, please consider follow up with your Primary Care Provider.  If you are age 16 or younger, your body mass index should be between 19-25. Your Body mass index is 22.76 kg/m. If this is out of the aformentioned range listed, please consider follow up with your Primary Care Provider.   ________________________________________________________  The [facility] providers would like to encourage you to use [facility] to communicate with providers for non-urgent requests or questions.  Due to long hold times on the telephone, sending your provider a message by [facility] may be a faster and more efficient way to get a response.  Please allow 48 business hours for a response.  Please remember that this is for non-urgent requests.  _______________________________________________________  Continue mesalamine (LIALDA) 1.2 G EC tablet daily.   You have been scheduled for a colonoscopy. Please follow written instructions given to you at your visit today.  Please pick up your prep supplies at the pharmacy within the next 1-3 days. If you use inhalers (even only as needed), please bring them with you on the day of your procedure.   Due to recent changes in healthcare laws, you may see the results of your imaging and laboratory studies on MyChart before your provider has had a chance to review them.  We understand that in some cases there may be results that are confusing or concerning to you. Not all laboratory results come back in the same time frame and the provider may be waiting for multiple results in order to interpret others.  Please give [place] 48 hours in order for your provider to thoroughly review all the results before contacting the office for clarification of your results.    Thank you for  entrusting me with your care and choosing [facility].  Dr Hilarie Fredrickson

## 2022-10-15 NOTE — Progress Notes (Signed)
Patient ID: [name], male   DOB: 10-22-1956, 66 y.o.   MRN: [id number] HPI: [name] is a 66 year old male with a history of ulcerative proctosigmoiditis diagnosed in 1993, hyperlipidemia and history of elevated PSA who is seen to establish care for his ulcerative proctosigmoiditis.  He is here alone today.  He was previously managed by Dr. Earlie Raveling.  At diagnosis in 1993 his symptoms were tenesmus, blood in stool, mucus in stool and increased stool frequency.  His disease has been well controlled for multiple years now and he has been able to maintain steroid free remission.  He is currently using Lialda 1.2 g a day but notes he can even skip several days in a row of this medication and not have recurrent symptoms.  He would intermittently use Rowasa enemas but has not needed this in 3 or 4 years.  Currently no symptoms of tenesmus, blood in stool, abdominal pain.  Stools are regular formed without blood or mucus.  No upper GI or hepatobiliary complaint.  He did note rare heartburn with spicy foods or if he uses NSAIDs or steroids.  If this occurs he will use a short course of over-the-counter Prilosec.  No dysphagia.  Bowel symptoms in the past have become slightly more noticeable during stressful situations in life.  No family history of IBD or colon cancer.  No ocular, skin or arthropathy symptoms associated with his IBD  Last colonoscopy was in 2018.  This was normal.  Internal hemorrhoids were seen.  Surveillance biopsies were normal.  See objective Prior records received by the patient: In 2007 there was rectal mucosa with focal ulceration and active proctitis. In 2001 there was chronic active colitis consistent with IBD negative for dysplasia in the sigmoid and rectum.  Past Medical History:  Diagnosis Date   Arthritis 03/2020   R hip   Asthma    Elevated PSA    History of adenomatous polyp of colon    Hypercholesteremia    Ulcerative colitis     Past Surgical History:   Procedure Laterality Date   LEFT HEART CATH AND CORONARY ANGIOGRAPHY N/A 05/06/2018   Procedure: LEFT HEART CATH AND CORONARY ANGIOGRAPHY;  Surgeon: Adrian Prows, MD;  Location: [facility];  Service: Cardiovascular;  Laterality: N/A;    Outpatient Medications Prior to Visit  Medication Sig Dispense Refill   albuterol (VENTOLIN HFA) 108 (90 Base) MCG/ACT inhaler Inhale 2 puffs into the lungs every 4 (four) hours as needed. 18 g 3   azithromycin (ZITHROMAX) 250 MG tablet Take 2 tablets (500 mg total) by mouth daily for 1 day, THEN 1 tablet (250 mg total) daily for 4 days. 6 tablet 0   celecoxib (CELEBREX) 200 MG capsule Take 200 mg by mouth daily.     celecoxib (CELEBREX) 200 MG capsule Take 1 capsule (200 mg total) by mouth daily. 90 capsule 2   Cholecalciferol (VITAMIN D3) 125 MCG (5000 UT) TABS Take 5,000 tablets by mouth. Twice weekly.     COVID-19 mRNA vaccine, Moderna, 100 MCG/0.5ML injection Inject into the muscle. 0.5 mL 0   diclofenac Sodium (VOLTAREN) 1 % GEL Apply 2 g topically to the affected area(s) 4 (four) times daily. 100 g 5   fluticasone (FLONASE) 50 MCG/ACT nasal spray Place 1 spray into both nostrils daily.     Fluticasone-Salmeterol (ADVAIR DISKUS) 100-50 MCG/DOSE AEPB Inhale 1 puff into the lungs as needed.     fluticasone-salmeterol (ADVAIR) 100-50 MCG/ACT AEPB Inhale 1 puff into the  lungs daily. 60 each 6   LIALDA 1.2 g EC tablet Take 1 tablet (1.2 g total) by mouth daily. BRAND MEDICATION ONLY 90 tablet 3   mesalamine (LIALDA) 1.2 G EC tablet Take 1.2 g by mouth daily with breakfast.      mesalamine (LIALDA) 1.2 g EC tablet Take 1 tablet (1.2 g total) by mouth daily. 90 tablet 4   methocarbamol (ROBAXIN) 500 MG tablet Take 1 tablet (500 mg total) by mouth every 8 (eight) hours. 90 tablet 1   methocarbamol (ROBAXIN) 500 MG tablet Take 1 tablet (500 mg total) by mouth every 8 (eight) hours. 90 tablet 1   metoprolol succinate (TOPROL XL) 25 MG 24 hr tablet Take 1 and  1/2 tablets (37.5 mg total) by mouth daily. 135 tablet 3   metoprolol succinate (TOPROL XL) 25 MG 24 hr tablet Take 1 and 1/2 tablets by mouth daily. 135 tablet 3   nystatin cream (MYCOSTATIN) Apply 2-3 times to affected areas as directed External 30 days 30 g 0   pregabalin (LYRICA) 25 MG capsule Take 1 capsule by mouth Twice a day 30 days 60 capsule 2   rosuvastatin (CRESTOR) 20 MG tablet Take 1 tablet (20 mg total) by mouth daily. 90 tablet 3   rosuvastatin (CRESTOR) 20 MG tablet TAKE 1 TABLET BY MOUTH DAILY 90 tablet 4   Tdap (BOOSTRIX) 5-2.5-18.5 LF-MCG/0.5 injection Inject into the muscle once as directed 0.5 mL 0   Zolpidem Tartrate 1.75 MG SUBL Place 1 tablet under the tongue once a night. 10 tablet 0   Zolpidem Tartrate 3.5 MG SUBL continuous as needed.     benzonatate (TESSALON) 200 MG capsule Take 1 capsule (200 mg total) by mouth every 8 (eight) hours as needed for cough 30 capsule 1   fluticasone-salmeterol (ADVAIR DISKUS) 100-50 MCG/ACT AEPB Inhale 1 puff by mouth daily 60 each 4   HYDROcodone bit-homatropine (HYCODAN) 5-1.5 MG/5ML syrup Take 5 mLs by mouth every 6 (six) hours as needed for cough 180 mL 0   levofloxacin (LEVAQUIN) 750 MG tablet Take 1 tablet by mouth the morning of the procedure 1 tablet 0   LIALDA 1.2 g EC tablet TAKE 1 TABLET (1.2 G TOTAL) BY MOUTH DAILY. 90 tablet 3   meloxicam (MOBIC) 15 MG tablet Take 1 tablet (15 mg total) by mouth daily. (Patient not taking: Reported on 10/15/2022) 30 tablet 0   mesalamine (LIALDA) 1.2 g EC tablet TAKE 1 TABLET (1.2 G TOTAL) BY MOUTH DAILY. 90 tablet 3   mesalamine (LIALDA) 1.2 g EC tablet TAKE ONE TABLET BY MOUTH DAILY 20 tablet 0   metoprolol succinate (TOPROL-XL) 25 MG 24 hr tablet Take 1.5 tablets (37.5 mg total) by mouth daily. 135 tablet 3   metoprolol succinate (TOPROL-XL) 25 MG 24 hr tablet TAKE 1 & 1/2 TABLETS BY MOUTH DAILY ONCE A DAY 90 DAYS 135 tablet 1   metoprolol succinate (TOPROL-XL) 25 MG 24 hr tablet TAKE 1 &  1/2 TABLETS BY MOUTH DAILY 135 tablet 0   nystatin cream (MYCOSTATIN) continuous as needed.     pregabalin (LYRICA) 25 MG capsule Take 1 capsule (25 mg total) by mouth 2 (two) times daily. 60 capsule 2   Zolpidem Tartrate 1.75 MG SUBL Place 1 tablet under the tongue once at night as needed 10 tablet 0   Zolpidem Tartrate 1.75 MG SUBL Dissolve 1 tablet (1.75 mg) under the tongue once a night 10 tablet 0   Zolpidem Tartrate 1.75 MG SUBL Place  1 tablet under the tongue at bedtime. 10 tablet 0   No facility-administered medications prior to visit.    No Known Allergies  No family history on file.  Social History   Tobacco Use   Smoking status: Never   Smokeless tobacco: Never  Vaping Use   Vaping Use: Never used  Substance Use Topics   Alcohol use: No   Drug use: No    ROS: As per history of present illness, otherwise negative  Ht 5' 9"  (1.753 m)   Wt 154 lb 2 oz (69.9 kg)   BMI 22.76 kg/m  Gen: awake, alert, NAD HEENT: anicteric CV: RRR, no mrg Pulm: CTA b/l Abd: soft, NT/ND, +BS throughout Ext: no c/c/e Neuro: nonfocal   RELEVANT LABS AND IMAGING:  Colonoscopy: [facility], Dec 2018:  SURGICAL PATHOLOGY REPORT   FINAL PATHOLOGIC DIAGNOSIS  MICROSCOPIC EXAMINATION AND DIAGNOSIS   A.  CECUM, BIOPSY:       Benign colonic mucosa with no diagnostic abnormality.   B.  ASCENDING COLON, BIOPSY:       Benign colonic mucosa with no diagnostic abnormality.   C.  TRANSVERSE COLON, BIOPSY:       Benign colonic mucosa with no diagnostic abnormality.   D.  DESCENDING COLON, BIOPSY:       Benign colonic mucosa with no diagnostic abnormality.   E.  SIGMOID COLON, BIOPSY:       Benign colonic mucosa with no diagnostic abnormality.   F.  RECTAL BIOPSY:       Benign reactive colonic mucosa.       No active inflammation identified.    I have personally reviewed the slides and/or other related  materials referenced, and have edited the report as part of my  pathologic  assessment and final interpretation.   Electronically Signed Out By:   Michele Mcalpine , MD  11/30/2017    ASSESSMENT/PLAN: 66 year old male with a history of ulcerative proctosigmoiditis diagnosed in 1993, hyperlipidemia and history of elevated PSA who is seen to establish care for his ulcerative proctosigmoiditis.   Ulcerative proctosigmoiditis (dx 1993) --he is doing quite well and has been able to maintain clinical but also endoscopic and histologic remission for multiple years now.  He is on very low-dose mesalamine orally.  Surveillance colonoscopy is reasonable at this time.  We discussed the risk, benefits and alternatives and he is agreeable and wishes to proceed -- Continue Lialda 1.2 g daily -- If symptoms flare in the future consider fecal calprotectin and we can always consider reusing Rowasa which has been effective for him in the past -- Colonoscopy in the [facility] for surveillance  2.  GERD --no alarm symptoms, rarely bothersome.  Okay for over-the-counter low-dose PPI when needed due to NSAID or prednisone use.      LP:NPYY, [place], [place],  [place] [id number]

## 2022-10-16 ENCOUNTER — Other Ambulatory Visit (HOSPITAL_COMMUNITY): Payer: Self-pay

## 2022-10-16 MED ORDER — ZOLPIDEM TARTRATE 1.75 MG SL SUBL
1.7500 mg | SUBLINGUAL_TABLET | Freq: Every day | SUBLINGUAL | 0 refills | Status: AC
  Filled 2022-10-16: qty 10, 10d supply, fill #0

## 2022-10-16 MED ORDER — NYSTATIN 100000 UNIT/GM EX CREA
TOPICAL_CREAM | CUTANEOUS | 0 refills | Status: DC
  Filled 2022-10-16: qty 30, 30d supply, fill #0

## 2022-10-17 ENCOUNTER — Other Ambulatory Visit (HOSPITAL_COMMUNITY): Payer: Self-pay

## 2022-10-17 DIAGNOSIS — J309 Allergic rhinitis, unspecified: Secondary | ICD-10-CM | POA: Diagnosis not present

## 2022-10-17 DIAGNOSIS — J0110 Acute frontal sinusitis, unspecified: Secondary | ICD-10-CM | POA: Diagnosis not present

## 2022-10-17 MED ORDER — AZITHROMYCIN 250 MG PO TABS
ORAL_TABLET | ORAL | 0 refills | Status: AC
  Filled 2022-10-17: qty 6, 5d supply, fill #0

## 2022-10-17 MED ORDER — PREDNISONE 10 MG PO TABS
ORAL_TABLET | ORAL | 0 refills | Status: AC
  Filled 2022-10-17: qty 12, 6d supply, fill #0

## 2022-10-20 ENCOUNTER — Ambulatory Visit
Admission: RE | Admit: 2022-10-20 | Discharge: 2022-10-20 | Disposition: A | Discharge status: Home / Self Care | Payer: 59 | Source: Ambulatory Visit | Attending: Internal Medicine | Admitting: Internal Medicine

## 2022-10-20 ENCOUNTER — Other Ambulatory Visit: Payer: Self-pay | Admitting: Internal Medicine

## 2022-10-20 ENCOUNTER — Encounter (HOSPITAL_COMMUNITY): Payer: Self-pay

## 2022-10-20 ENCOUNTER — Ambulatory Visit (HOSPITAL_COMMUNITY): Payer: 59 | Attending: Internal Medicine

## 2022-10-20 ENCOUNTER — Other Ambulatory Visit (HOSPITAL_COMMUNITY): Payer: Self-pay | Admitting: Internal Medicine

## 2022-10-20 ENCOUNTER — Telehealth: Payer: Self-pay | Admitting: Internal Medicine

## 2022-10-20 ENCOUNTER — Encounter: Payer: Self-pay | Admitting: Internal Medicine

## 2022-10-20 DIAGNOSIS — R1012 Left upper quadrant pain: Secondary | ICD-10-CM

## 2022-10-20 DIAGNOSIS — K5190 Ulcerative colitis, unspecified, without complications: Secondary | ICD-10-CM | POA: Diagnosis not present

## 2022-10-20 DIAGNOSIS — R933 Abnormal findings on diagnostic imaging of other parts of digestive tract: Secondary | ICD-10-CM

## 2022-10-20 DIAGNOSIS — J0110 Acute frontal sinusitis, unspecified: Secondary | ICD-10-CM

## 2022-10-20 DIAGNOSIS — K769 Liver disease, unspecified: Secondary | ICD-10-CM | POA: Diagnosis not present

## 2022-10-20 DIAGNOSIS — E785 Hyperlipidemia, unspecified: Secondary | ICD-10-CM | POA: Diagnosis not present

## 2022-10-20 DIAGNOSIS — R109 Unspecified abdominal pain: Secondary | ICD-10-CM | POA: Diagnosis not present

## 2022-10-20 DIAGNOSIS — K6389 Other specified diseases of intestine: Secondary | ICD-10-CM | POA: Diagnosis not present

## 2022-10-20 DIAGNOSIS — R7989 Other specified abnormal findings of blood chemistry: Secondary | ICD-10-CM | POA: Diagnosis not present

## 2022-10-20 DIAGNOSIS — E232 Diabetes insipidus: Secondary | ICD-10-CM | POA: Diagnosis not present

## 2022-10-20 MED ORDER — IOPAMIDOL (ISOVUE-300) INJECTION 61%
100.0000 mL | Freq: Once | INTRAVENOUS | Status: AC | PRN
  Administered 2022-10-20: 100 mL via INTRAVENOUS

## 2022-10-20 MED ORDER — HYOSCYAMINE SULFATE 0.125 MG PO TABS: 0.1250 mg | ORAL_TABLET | Freq: Four times a day (QID) | ORAL | 1 refills | Status: DC | PRN

## 2022-10-20 NOTE — Telephone Encounter (Signed)
Inbound call from patient requesting to speak with someone in regards to him having pain in his upper eft side. Please advise.

## 2022-10-20 NOTE — Telephone Encounter (Signed)
Lm on vm for pt to return call, I told pt that I will also reach out to him via MyChart. Pt also sent a MyChart message with same issues.

## 2022-10-20 NOTE — Telephone Encounter (Signed)
Pt returned call. He states that he is taking Prednisone and Levaquin that was prescribed for sinusitis. Pt states that he took 1 tablet of Levaquin yesterday and has been having persistent LUQ pain. Pt had semi formed stools today. Pt describes LUQ pain as sharp and persistent. Pt does not feel that the pain is reflux related. He took Prilosec 2 times yesterday throughout the day and night. Pt denies any fever. He requested to be seen by you today, I informed pt that you are performing procedures today and could see him tomorrow morning. Pt states that he is currently at his PCP's office for evaluation also. Seeking recommendations, he asked if you could call his PCP to discuss his case. Please advise, thanks.

## 2022-10-20 NOTE — Telephone Encounter (Signed)
I spoke to Dr. Dagmar Hait by phone who saw the patient today.  He had some mild pinpoint tenderness in the left upper abdomen but no rebound or guarding.  Blood counts were normal.  He did have mild hyponatremia which is not new for him. CT scan abdomen pelvis was performed which I reviewed --it shows some thickening in the descending and sigmoid colon Previously his IBD had been confined to the rectum and distal sigmoid. He had been on prednisone but also changed to Levaquin for sinusitis Symptoms started acutely just after midnight.  Acute symptoms like this or not consistent with IBD and it should be noted that 5 days ago when he establish care with me he did not have GI symptoms.  I am more suspicious for either antibiotic side effect or even mild ischemic colitis which occurred acutely.  This would be associated with a watershed territory such as a splenic flexure which is where his pain is and also thickening is seen by CT.  He will be traveling to [place] in 2 days but I asked that he keep me informed if symptoms or not improving He does note that symptoms have gotten gradually better throughout the day  Bland diet low residue diet I am calling in Levsin 0.125 mg, 1 to 2 tablets sublingual every 6-8 hours as needed for crampy abdominal pain  We will leave colonoscopy scheduled in December for follow-up and surveillance of ulcerative proctosigmoiditis but now also abnormal GI imaging

## 2022-10-21 ENCOUNTER — Other Ambulatory Visit: Payer: Self-pay

## 2022-10-21 ENCOUNTER — Encounter: Payer: Self-pay | Admitting: Internal Medicine

## 2022-10-21 MED ORDER — PANTOPRAZOLE SODIUM 40 MG PO TBEC: 40.0000 mg | DELAYED_RELEASE_TABLET | Freq: Every day | ORAL | 3 refills | Status: DC

## 2022-10-22 ENCOUNTER — Encounter (HOSPITAL_COMMUNITY): Payer: Self-pay | Admitting: Emergency Medicine

## 2022-10-22 ENCOUNTER — Emergency Department (HOSPITAL_COMMUNITY): Payer: 59

## 2022-10-22 ENCOUNTER — Emergency Department (HOSPITAL_COMMUNITY)
Admission: EM | Admit: 2022-10-22 | Discharge: 2022-10-22 | Discharge status: Against Medical Advice | Payer: 59 | Attending: Emergency Medicine | Admitting: Emergency Medicine

## 2022-10-22 ENCOUNTER — Other Ambulatory Visit: Payer: Self-pay

## 2022-10-22 DIAGNOSIS — R0981 Nasal congestion: Secondary | ICD-10-CM | POA: Insufficient documentation

## 2022-10-22 DIAGNOSIS — R197 Diarrhea, unspecified: Secondary | ICD-10-CM | POA: Insufficient documentation

## 2022-10-22 DIAGNOSIS — R519 Headache, unspecified: Secondary | ICD-10-CM | POA: Diagnosis not present

## 2022-10-22 DIAGNOSIS — Z5321 Procedure and treatment not carried out due to patient leaving prior to being seen by health care provider: Secondary | ICD-10-CM | POA: Diagnosis not present

## 2022-10-22 DIAGNOSIS — R109 Unspecified abdominal pain: Secondary | ICD-10-CM | POA: Insufficient documentation

## 2022-10-22 LAB — CBC WITH DIFFERENTIAL/PLATELET
Abs Immature Granulocytes: 0.02 10*3/uL (ref 0.00–0.07)
Basophils Absolute: 0.1 10*3/uL (ref 0.0–0.1)
Basophils Relative: 1 %
Eosinophils Absolute: 1 10*3/uL — ABNORMAL HIGH (ref 0.0–0.5)
Eosinophils Relative: 11 %
HCT: 45.6 % (ref 39.0–52.0)
Hemoglobin: 16.2 g/dL (ref 13.0–17.0)
Immature Granulocytes: 0 %
Lymphocytes Relative: 39 %
Lymphs Abs: 3.5 10*3/uL (ref 0.7–4.0)
MCH: 31 pg (ref 26.0–34.0)
MCHC: 35.5 g/dL (ref 30.0–36.0)
MCV: 87.4 fL (ref 80.0–100.0)
Monocytes Absolute: 0.9 10*3/uL (ref 0.1–1.0)
Monocytes Relative: 10 %
Neutro Abs: 3.7 10*3/uL (ref 1.7–7.7)
Neutrophils Relative %: 39 %
Platelets: 267 10*3/uL (ref 150–400)
RBC: 5.22 MIL/uL (ref 4.22–5.81)
RDW: 13 % (ref 11.5–15.5)
WBC: 9 10*3/uL (ref 4.0–10.5)
nRBC: 0 % (ref 0.0–0.2)

## 2022-10-22 LAB — BASIC METABOLIC PANEL
Anion gap: 10 (ref 5–15)
BUN: 13 mg/dL (ref 8–23)
CO2: 25 mmol/L (ref 22–32)
Calcium: 9.8 mg/dL (ref 8.9–10.3)
Chloride: 98 mmol/L (ref 98–111)
Creatinine, Ser: 0.96 mg/dL (ref 0.61–1.24)
GFR, Estimated: >60 mL/min (ref >60)
Glucose, Bld: 105 mg/dL — ABNORMAL HIGH (ref 70–99)
Potassium: 4.5 mmol/L (ref 3.5–5.1)
Sodium: 133 mmol/L — ABNORMAL LOW (ref 135–145)

## 2022-10-22 NOTE — ED Notes (Signed)
Patient states he is leaving.

## 2022-10-22 NOTE — ED Notes (Signed)
Patient refusing vitals stating he might possibly leave

## 2022-10-22 NOTE — ED Provider Triage Note (Signed)
Emergency Medicine Provider Triage Evaluation Note  [name] , a 66 y.o. male  was evaluated in triage.  Pt complains of frontal headache for the past 2 to 3 days that significantly worsened around 2 AM this morning.  Patient states he was having a hard time sleeping and then noticed a headache.  The headache did not wake him up from his sleep.  He states several months ago he was diagnosed with arachnoid cyst which was incidentally found.  He was not having headaches at that time.  No history of migraines.  He was supposed to follow-up with neurosurgeon in 6 months.  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  States he has been struggling with sinus congestion and has been trialed on 3 antibiotic courses recently.  Most recently he had 1 dose of Levaquin but stopped due to abdominal pain and loose stools.  Review of Systems  Positive: As above Negative: As above  Physical Exam  BP (!) 170/98 (BP Location: Right Arm)   Pulse 72   Temp 98.3 F (36.8 C) (Oral)   Resp 18   SpO2 99%  Gen:   Awake, no distress   Resp:  Normal effort  MSK:   Moves extremities without difficulty  Other:  Cranial nerves III through XII intact.  Full range of motion in bilateral upper and lower extremities with 5/5 strength in extensor and flexor muscle groups.  Sensation intact and symmetrical bilaterally.  Tongue midline.  No facial droop.  No pronator drift.  Medical Decision Making  Medically screening exam initiated at 3:39 AM.  Appropriate orders placed.  [name] was informed that the remainder of the evaluation will be completed by another provider, this initial triage assessment does not replace that evaluation, and the importance of remaining in the ED until their evaluation is complete.     Evlyn Courier, PA-C 10/22/22 [phone]

## 2022-10-22 NOTE — ED Triage Notes (Signed)
Patient with gradual onset of headache.  Patient has had sinus congestion but the pain progressively got worse after 2am.  Patient does have a history of arachnoid cyst.  No nausea, no vomiting.  No dizziness.

## 2022-11-09 ENCOUNTER — Encounter: Payer: Self-pay | Admitting: Internal Medicine

## 2022-11-10 ENCOUNTER — Other Ambulatory Visit: Payer: Self-pay

## 2022-11-10 ENCOUNTER — Telehealth: Payer: Self-pay | Admitting: *Deleted

## 2022-11-10 ENCOUNTER — Telehealth: Payer: Self-pay | Admitting: Internal Medicine

## 2022-11-10 DIAGNOSIS — R1013 Epigastric pain: Secondary | ICD-10-CM

## 2022-11-10 DIAGNOSIS — R1012 Left upper quadrant pain: Secondary | ICD-10-CM

## 2022-11-10 MED ORDER — MESALAMINE 1.2 G PO TBEC
2.4000 g | DELAYED_RELEASE_TABLET | Freq: Two times a day (BID) | ORAL | 1 refills | Status: DC
  Filled 2022-11-10: qty 360, 90d supply, fill #0
  Filled 2023-03-19: qty 240, 60d supply, fill #0

## 2022-11-10 NOTE — Telephone Encounter (Signed)
yes

## 2022-11-10 NOTE — Telephone Encounter (Signed)
See additional phone note placed 11/10/22 for other correspondence related to this.

## 2022-11-10 NOTE — Telephone Encounter (Signed)
Patient called with ongoing issues with epigastric pain, worse after eating He has been taking Dexilant but also using over-the-counter Prilosec and Mylanta Recent trip to [place] Mild nausea, no vomiting No fever or chest pain  Feels that colitis symptoms are improving  Given ongoing issues I recommend upper endoscopy for direct visualization.  I discussed this with him and he is agreeable.  Please schedule upper endoscopy for 11 AM tomorrow in the [facility] Patient aware to be n.p.o. and arrive at 10 AM but please call him to ensure he is aware of these instructions/reiterate the instructions  Please schedule procedure and hopefully this can be precertified quickly  Thanks to all for your help  JMP

## 2022-11-10 NOTE — Telephone Encounter (Signed)
Dr Hilarie Fredrickson- Patient requesting updated lialda rx to 1.2 g- take 2 tablets twice daily. However, office notes only note that he is taking Lialda 1.2 g tablet once daily. May I send new rx for requested dosing?

## 2022-11-10 NOTE — Telephone Encounter (Signed)
Dr Hilarie Fredrickson has requested that patient be scheduled for endoscopy tomorrow at [facility] due to continue epigastric abdominal pain. Patient has been scheduled for 11/11/22 at 11 am, 10 am arrival. Instructions have been created and made available in mychart for patient review. In addition, I have contacted the patient and have given basic prep instruction for endoscopy. I also advised he will need a care partner at least 68 years old to bring him, stay with him and take him home since he will be sedated. Patient verbalizes understanding of this information.

## 2022-11-11 ENCOUNTER — Encounter: Payer: Self-pay | Admitting: Internal Medicine

## 2022-11-11 ENCOUNTER — Other Ambulatory Visit: Payer: Self-pay

## 2022-11-11 ENCOUNTER — Ambulatory Visit (AMBULATORY_SURGERY_CENTER): Payer: 59 | Admitting: Internal Medicine

## 2022-11-11 VITALS — BP 128/70 | HR 63 | Temp 96.4°F | Resp 17 | Ht 69.0 in | Wt 154.0 lb

## 2022-11-11 DIAGNOSIS — K2970 Gastritis, unspecified, without bleeding: Secondary | ICD-10-CM | POA: Diagnosis not present

## 2022-11-11 DIAGNOSIS — E7800 Pure hypercholesterolemia, unspecified: Secondary | ICD-10-CM | POA: Diagnosis not present

## 2022-11-11 DIAGNOSIS — K2950 Unspecified chronic gastritis without bleeding: Secondary | ICD-10-CM | POA: Diagnosis not present

## 2022-11-11 DIAGNOSIS — R1013 Epigastric pain: Secondary | ICD-10-CM | POA: Diagnosis not present

## 2022-11-11 DIAGNOSIS — J45909 Unspecified asthma, uncomplicated: Secondary | ICD-10-CM | POA: Diagnosis not present

## 2022-11-11 MED ORDER — SODIUM CHLORIDE 0.9 % IV SOLN: 500.0000 mL | Freq: Once | INTRAVENOUS | Status: DC

## 2022-11-11 NOTE — Progress Notes (Signed)
To pacu, VSS. Report to [email or web address] 

## 2022-11-11 NOTE — Op Note (Signed)
[name] Patient Name: [name] Procedure Date: 11/11/2022 11:20 AM MRN: [id number] Endoscopist: Jerene Bears , MD, [id number] Age: 66 Referring MD:  Date of Birth: 06-06-1956 Gender: Male Account #: [id number] Procedure:                Upper GI endoscopy Indications:              Epigastric abdominal pain, hx of ulcerative                            proctosigmoiditis, recent CT scan with left sided                            colitis, colonoscopy pending Medicines:                Monitored Anesthesia Care Procedure:                Pre-Anesthesia Assessment:                           - Prior to the procedure, a History and Physical                            was performed, and patient medications and                            allergies were reviewed. The patient's tolerance of                            previous anesthesia was also reviewed. The risks                            and benefits of the procedure and the sedation                            options and risks were discussed with the patient.                            All questions were answered, and informed consent                            was obtained. Prior Anticoagulants: The patient has                            taken no anticoagulant or antiplatelet agents. ASA                            Grade Assessment: II - A patient with mild systemic                            disease. After reviewing the risks and benefits,                            the patient was deemed in satisfactory condition to  undergo the procedure.                           After obtaining informed consent, the endoscope was                            passed under direct vision. Throughout the                            procedure, the patient's blood pressure, pulse, and                            oxygen saturations were monitored continuously. The                            Endoscope was introduced through  the mouth, and                            advanced to the second part of duodenum. The upper                            GI endoscopy was accomplished without difficulty.                            The patient tolerated the procedure well. Scope In: Scope Out: Findings:                 The examined esophagus was normal.                           Striped mildly erythematous mucosa was found in the                            cardia and in the gastric fundus.                           The exam of the stomach was otherwise normal.                           Biopsies were taken with a cold forceps in the                            gastric fundus, in the gastric body, at the                            incisura and in the gastric antrum for histology                            and Helicobacter pylori testing.                           The examined duodenum was normal. Complications:            No immediate complications. Estimated Blood Loss:     Estimated blood loss was minimal. Impression:               -  Normal esophagus.                           - Erythematous mucosa in the cardia and gastric                            fundus.                           - Normal examined duodenum.                           - Biopsies were taken with a cold forceps for                            histology and Helicobacter pylori testing. Recommendation:           - Patient has a contact number available for                            emergencies. The signs and symptoms of potential                            delayed complications were discussed with the                            patient. Return to normal activities tomorrow.                            Written discharge instructions were provided to the                            patient.                           - Resume previous diet.                           - Continue present medications.                           - Await pathology results. Jerene Bears, MD 11/11/2022 11:45:13 AM This report has been signed electronically.

## 2022-11-11 NOTE — Progress Notes (Unsigned)
GASTROENTEROLOGY PROCEDURE H&P NOTE   Primary Care Physician: Prince Solian, MD    Reason for Procedure:   Epi gastric pain, LUQ pain  Plan:    EGD  Patient is appropriate for endoscopic procedure(s) in the ambulatory ([facility]) setting.  The nature of the procedure, as well as the risks, benefits, and alternatives were carefully and thoroughly reviewed with the patient. Ample time for discussion and questions allowed. The patient understood, was satisfied, and agreed to proceed.     HPI: [name] is a 66 y.o. male who presents for EGD.  Medical history as below.  No recent chest pain or shortness of breath.  No abdominal pain today.  Past Medical History:  Diagnosis Date   Arthritis 03/2020   R hip   Asthma    Elevated PSA    History of adenomatous polyp of colon    Hypercholesteremia    Monoallelic mutation of AVP gene    Ulcerative colitis     Past Surgical History:  Procedure Laterality Date   ATRIAL FIBRILLATION ABLATION     COLONOSCOPY     LEFT HEART CATH AND CORONARY ANGIOGRAPHY N/A 05/06/2018   Procedure: LEFT HEART CATH AND CORONARY ANGIOGRAPHY;  Surgeon: Adrian Prows, MD;  Location: [facility];  Service: Cardiovascular;  Laterality: N/A;    Prior to Admission medications   Medication Sig Start Date End Date Taking? Authorizing Provider  celecoxib (CELEBREX) 200 MG capsule Take 200 mg by mouth daily.   Yes [provider]  dexlansoprazole (DEXILANT) 60 MG capsule Take 60 mg by mouth daily.   Yes [provider]  diclofenac Sodium (VOLTAREN) 1 % GEL Apply 2 g topically to the affected area(s) 4 (four) times daily. 12/23/21  Yes   fluticasone (FLONASE) 50 MCG/ACT nasal spray Place 1 spray into both nostrils daily.   Yes [provider]  fluticasone-salmeterol (ADVAIR DISKUS) 100-50 MCG/ACT AEPB Inhale 1 puff by mouth daily 07/24/20 11/11/22 Yes Avva, Ravisankar, MD  Fluticasone-Salmeterol (ADVAIR DISKUS) 100-50 MCG/DOSE AEPB  Inhale 1 puff into the lungs as needed.   Yes [provider]  fluticasone-salmeterol (ADVAIR) 100-50 MCG/ACT AEPB Inhale 1 puff into the lungs daily. 12/17/21  Yes   hyoscyamine (LEVSIN) 0.125 MG tablet Take 1-2 tablets (0.125-0.25 mg total) by mouth every 6 (six) hours as needed for cramping (cramping abdominal pain). 10/20/22  Yes Jailene Cupit, Lajuan Lines, MD  mesalamine (LIALDA) 1.2 g EC tablet Take 2 tablets (2.4 g total) by mouth in the morning and at bedtime. 11/10/22  Yes Axell Trigueros, Lajuan Lines, MD  metoprolol succinate (TOPROL XL) 25 MG 24 hr tablet Take 1 and 1/2 tablets by mouth daily. 06/18/22  Yes   pregabalin (LYRICA) 25 MG capsule Take 1 capsule by mouth Twice a day 30 days 05/10/21  Yes   rosuvastatin (CRESTOR) 20 MG tablet TAKE 1 TABLET BY MOUTH DAILY 05/29/22  Yes   albuterol (VENTOLIN HFA) 108 (90 Base) MCG/ACT inhaler Inhale 2 puffs into the lungs every 4 (four) hours as needed. 03/04/22     benzonatate (TESSALON) 200 MG capsule Take 1 capsule (200 mg total) by mouth every 8 (eight) hours as needed for cough 03/04/22     Cholecalciferol (VITAMIN D3) 125 MCG (5000 UT) TABS Take 5,000 tablets by mouth. Twice weekly.    [provider]  COVID-19 mRNA vaccine, Moderna, 100 MCG/0.5ML injection Inject into the muscle. 05/15/21   Carlyle Basques, MD  HYDROcodone bit-homatropine (HYCODAN) 5-1.5 MG/5ML syrup Take 5 mLs by  mouth every 6 (six) hours as needed for cough 03/04/22     levofloxacin (LEVAQUIN) 750 MG tablet Take 1 tablet by mouth the morning of the procedure 06/26/21     meloxicam (MOBIC) 15 MG tablet Take 1 tablet (15 mg total) by mouth daily. Patient not taking: Reported on 10/15/2022 08/20/22     methocarbamol (ROBAXIN) 500 MG tablet Take 1 tablet (500 mg total) by mouth every 8 (eight) hours. 08/26/22     metoprolol succinate (TOPROL-XL) 25 MG 24 hr tablet Take 1.5 tablets (37.5 mg total) by mouth daily. 12/30/19   Adrian Prows, MD  metoprolol succinate (TOPROL-XL) 25 MG 24 hr tablet TAKE 1 & 1/2  TABLETS BY MOUTH DAILY ONCE A DAY 90 DAYS 02/27/21 02/27/22  Avva, Ravisankar, MD  metoprolol succinate (TOPROL-XL) 25 MG 24 hr tablet TAKE 1 & 1/2 TABLETS BY MOUTH DAILY 12/25/20 12/25/21  Avva, Steva Ready, MD  Na Sulfate-K Sulfate-Mg Sulf 17.5-3.13-1.6 GM/177ML SOLN Use as directed 10/15/22   Idonna Heeren, Lajuan Lines, MD  nystatin cream (MYCOSTATIN) Apply 2-3 times to affected areas as directed External 30 days 04/08/21     nystatin cream (MYCOSTATIN) Apply 2-3 times to affected areas as directed External 30 days 10/16/22     pantoprazole (PROTONIX) 40 MG tablet Take 1 tablet (40 mg total) by mouth daily. 10/21/22   Herberto Ledwell, Lajuan Lines, MD  Tdap Durwin Reges) 5-2.5-18.5 LF-MCG/0.5 injection Inject into the muscle once as directed 06/13/21   Carlyle Basques, MD  Zolpidem Tartrate 1.75 MG SUBL Place 1 tablet under the tongue once a night. Patient not taking: Reported on 11/11/2022 04/01/21     Zolpidem Tartrate 1.75 MG SUBL Place 1 tablet under the tongue once a night. 10/16/22     Zolpidem Tartrate 3.5 MG SUBL continuous as needed. Patient not taking: Reported on 11/11/2022 12/02/18   [provider]    Current Outpatient Medications  Medication Sig Dispense Refill   celecoxib (CELEBREX) 200 MG capsule Take 200 mg by mouth daily.     dexlansoprazole (DEXILANT) 60 MG capsule Take 60 mg by mouth daily.     diclofenac Sodium (VOLTAREN) 1 % GEL Apply 2 g topically to the affected area(s) 4 (four) times daily. 100 g 5   fluticasone (FLONASE) 50 MCG/ACT nasal spray Place 1 spray into both nostrils daily.     fluticasone-salmeterol (ADVAIR DISKUS) 100-50 MCG/ACT AEPB Inhale 1 puff by mouth daily 60 each 4   Fluticasone-Salmeterol (ADVAIR DISKUS) 100-50 MCG/DOSE AEPB Inhale 1 puff into the lungs as needed.     fluticasone-salmeterol (ADVAIR) 100-50 MCG/ACT AEPB Inhale 1 puff into the lungs daily. 60 each 6   hyoscyamine (LEVSIN) 0.125 MG tablet Take 1-2 tablets (0.125-0.25 mg total) by mouth every 6 (six) hours as needed  for cramping (cramping abdominal pain). 60 tablet 1   mesalamine (LIALDA) 1.2 g EC tablet Take 2 tablets (2.4 g total) by mouth in the morning and at bedtime. 360 tablet 1   metoprolol succinate (TOPROL XL) 25 MG 24 hr tablet Take 1 and 1/2 tablets by mouth daily. 135 tablet 3   pregabalin (LYRICA) 25 MG capsule Take 1 capsule by mouth Twice a day 30 days 60 capsule 2   rosuvastatin (CRESTOR) 20 MG tablet TAKE 1 TABLET BY MOUTH DAILY 90 tablet 4   albuterol (VENTOLIN HFA) 108 (90 Base) MCG/ACT inhaler Inhale 2 puffs into the lungs every 4 (four) hours as needed. 18 g 3   benzonatate (TESSALON) 200 MG capsule Take 1 capsule (200  mg total) by mouth every 8 (eight) hours as needed for cough 30 capsule 1   Cholecalciferol (VITAMIN D3) 125 MCG (5000 UT) TABS Take 5,000 tablets by mouth. Twice weekly.     COVID-19 mRNA vaccine, Moderna, 100 MCG/0.5ML injection Inject into the muscle. 0.5 mL 0   HYDROcodone bit-homatropine (HYCODAN) 5-1.5 MG/5ML syrup Take 5 mLs by mouth every 6 (six) hours as needed for cough 180 mL 0   levofloxacin (LEVAQUIN) 750 MG tablet Take 1 tablet by mouth the morning of the procedure 1 tablet 0   meloxicam (MOBIC) 15 MG tablet Take 1 tablet (15 mg total) by mouth daily. (Patient not taking: Reported on 10/15/2022) 30 tablet 0   methocarbamol (ROBAXIN) 500 MG tablet Take 1 tablet (500 mg total) by mouth every 8 (eight) hours. 90 tablet 1   metoprolol succinate (TOPROL-XL) 25 MG 24 hr tablet Take 1.5 tablets (37.5 mg total) by mouth daily. 135 tablet 3   metoprolol succinate (TOPROL-XL) 25 MG 24 hr tablet TAKE 1 & 1/2 TABLETS BY MOUTH DAILY ONCE A DAY 90 DAYS 135 tablet 1   metoprolol succinate (TOPROL-XL) 25 MG 24 hr tablet TAKE 1 & 1/2 TABLETS BY MOUTH DAILY 135 tablet 0   Na Sulfate-K Sulfate-Mg Sulf 17.5-3.13-1.6 GM/177ML SOLN Use as directed 354 mL 0   nystatin cream (MYCOSTATIN) Apply 2-3 times to affected areas as directed External 30 days 30 g 0   nystatin cream (MYCOSTATIN)  Apply 2-3 times to affected areas as directed External 30 days 30 g 0   pantoprazole (PROTONIX) 40 MG tablet Take 1 tablet (40 mg total) by mouth daily. 90 tablet 3   Tdap (BOOSTRIX) 5-2.5-18.5 LF-MCG/0.5 injection Inject into the muscle once as directed 0.5 mL 0   Zolpidem Tartrate 1.75 MG SUBL Place 1 tablet under the tongue once a night. (Patient not taking: Reported on 11/11/2022) 10 tablet 0   Zolpidem Tartrate 1.75 MG SUBL Place 1 tablet under the tongue once a night. 10 tablet 0   Zolpidem Tartrate 3.5 MG SUBL continuous as needed. (Patient not taking: Reported on 11/11/2022)     Current Facility-Administered Medications  Medication Dose Route Frequency Provider Last Rate Last Admin   0.9 %  sodium chloride infusion  500 mL Intravenous Once Boyd Litaker, Lajuan Lines, MD        Allergies as of 11/11/2022   (No Known Allergies)    Family History  Problem Relation Age of Onset   Heart disease Father    Colon cancer Neg Hx    Esophageal cancer Neg Hx    Stomach cancer Neg Hx    Rectal cancer Neg Hx     Social History   Socioeconomic History   Marital status: Married    Spouse name: Not on file   Number of children: 2   Years of education: Not on file   Highest education level: Not on file  Occupational History   Occupation: MD  Tobacco Use   Smoking status: Never   Smokeless tobacco: Never  Vaping Use   Vaping Use: Never used  Substance and Sexual Activity   Alcohol use: No   Drug use: No   Sexual activity: Not on file  Other Topics Concern   Not on file  Social History Narrative   Not on file   Social Determinants of Health   Financial Resource Strain: Not on file  Food Insecurity: Not on file  Transportation Needs: Not on file  Physical Activity: Not on file  Stress: Not on file  Social Connections: Not on file  Intimate Partner Violence: Not on file    Physical Exam: Vital signs in last 24 hours: @BP  129/65   Pulse 60   Temp (!) 96.4 F (35.8 C) (Temporal)    Ht 5' 9"  (1.753 m)   Wt 154 lb (69.9 kg)   SpO2 100%   BMI 22.74 kg/m  GEN: NAD EYE: Sclerae anicteric ENT: MMM CV: Non-tachycardic Pulm: CTA b/l GI: Soft, NT/ND NEURO:  Alert & Oriented x 3   Zenovia Jarred, MD [facility]  11/11/2022 11:15 AM

## 2022-11-11 NOTE — Progress Notes (Signed)
VS completed by DT.  Pt's states no medical or surgical changes since previsit or office visit.  

## 2022-11-11 NOTE — Progress Notes (Signed)
Called to room to assist during endoscopic procedure.  Patient ID and intended procedure confirmed with present staff. Received instructions for my participation in the procedure from the performing physician.  

## 2022-11-11 NOTE — Patient Instructions (Signed)
Discharge instructions given. Biopsies taken. Resume previous medications. YOU HAD AN ENDOSCOPIC PROCEDURE TODAY AT [facility]:   Refer to the procedure report that was given to you for any specific questions about what was found during the examination.  If the procedure report does not answer your questions, please call your gastroenterologist to clarify.  If you requested that your care partner not be given the details of your procedure findings, then the procedure report has been included in a sealed envelope for you to review at your convenience later.  YOU SHOULD EXPECT: Some feelings of bloating in the abdomen. Passage of more gas than usual.  Walking can help get rid of the air that was put into your GI tract during the procedure and reduce the bloating. If you had a lower endoscopy (such as a colonoscopy or flexible sigmoidoscopy) you may notice spotting of blood in your stool or on the toilet paper. If you underwent a bowel prep for your procedure, you may not have a normal bowel movement for a few days.  Please Note:  You might notice some irritation and congestion in your nose or some drainage.  This is from the oxygen used during your procedure.  There is no need for concern and it should clear up in a day or so.  SYMPTOMS TO REPORT IMMEDIATELY:   Following upper endoscopy (EGD)  Vomiting of blood or coffee ground material  New chest pain or pain under the shoulder blades  Painful or persistently difficult swallowing  New shortness of breath  Fever of 100F or higher  Black, tarry-looking stools  For urgent or emergent issues, a gastroenterologist can be reached at any hour by calling [phone]. Do not use MyChart messaging for urgent concerns.    DIET:  We do recommend a small meal at first, but then you may proceed to your regular diet.  Drink plenty of fluids but you should avoid alcoholic beverages for 24 hours.  ACTIVITY:  You should plan to take it  easy for the rest of today and you should NOT DRIVE or use heavy machinery until tomorrow (because of the sedation medicines used during the test).    FOLLOW UP: Our staff will call the number listed on your records the next business day following your procedure.  We will call around 7:15- 8:00 am to check on you and address any questions or concerns that you may have regarding the information given to you following your procedure. If we do not reach you, we will leave a message.     If any biopsies were taken you will be contacted by phone or by letter within the next 1-3 weeks.  Please call us at [phone] if you have not heard about the biopsies in 3 weeks.    SIGNATURES/CONFIDENTIALITY: You and/or your care partner have signed paperwork which will be entered into your electronic medical record.  These signatures attest to the fact that that the information above on your After Visit Summary has been reviewed and is understood.  Full responsibility of the confidentiality of this discharge information lies with you and/or your care-partner.

## 2022-11-12 ENCOUNTER — Telehealth: Payer: Self-pay

## 2022-11-12 NOTE — Telephone Encounter (Signed)
Post procedure follow up call, no answer.

## 2022-11-13 ENCOUNTER — Other Ambulatory Visit (INDEPENDENT_AMBULATORY_CARE_PROVIDER_SITE_OTHER): Payer: 59

## 2022-11-13 ENCOUNTER — Other Ambulatory Visit: Payer: Self-pay

## 2022-11-13 ENCOUNTER — Telehealth: Payer: Self-pay | Admitting: *Deleted

## 2022-11-13 ENCOUNTER — Encounter: Payer: Self-pay | Admitting: Internal Medicine

## 2022-11-13 ENCOUNTER — Telehealth: Payer: Self-pay

## 2022-11-13 ENCOUNTER — Telehealth: Payer: Self-pay | Admitting: Internal Medicine

## 2022-11-13 DIAGNOSIS — R1013 Epigastric pain: Secondary | ICD-10-CM | POA: Diagnosis not present

## 2022-11-13 LAB — CBC WITH DIFFERENTIAL/PLATELET
Basophils Absolute: 0 10*3/uL (ref 0.0–0.1)
Basophils Relative: 0.6 % (ref 0.0–3.0)
Eosinophils Absolute: 0.1 10*3/uL (ref 0.0–0.7)
Eosinophils Relative: 1.4 % (ref 0.0–5.0)
HCT: 43 % (ref 39.0–52.0)
Hemoglobin: 14.6 g/dL (ref 13.0–17.0)
Lymphocytes Relative: 37.2 % (ref 12.0–46.0)
Lymphs Abs: 1.9 10*3/uL (ref 0.7–4.0)
MCHC: 34 g/dL (ref 30.0–36.0)
MCV: 90 fl (ref 78.0–100.0)
Monocytes Absolute: 0.8 10*3/uL (ref 0.1–1.0)
Monocytes Relative: 16.3 % — ABNORMAL HIGH (ref 3.0–12.0)
Neutro Abs: 2.3 10*3/uL (ref 1.4–7.7)
Neutrophils Relative %: 44.5 % (ref 43.0–77.0)
Platelets: 244 10*3/uL (ref 150.0–400.0)
RBC: 4.77 Mil/uL (ref 4.22–5.81)
RDW: 14 % (ref 11.5–15.5)
WBC: 5.2 10*3/uL (ref 4.0–10.5)

## 2022-11-13 LAB — COMPREHENSIVE METABOLIC PANEL
ALT: 19 U/L (ref 0–53)
AST: 19 U/L (ref 0–37)
Albumin: 4.1 g/dL (ref 3.5–5.2)
Alkaline Phosphatase: 63 U/L (ref 39–117)
BUN: 11 mg/dL (ref 6–23)
CO2: 28 mEq/L (ref 19–32)
Calcium: 9.2 mg/dL (ref 8.4–10.5)
Chloride: 101 mEq/L (ref 96–112)
Creatinine, Ser: 0.8 mg/dL (ref 0.40–1.50)
GFR: 91.01 mL/min (ref >60.00)
Glucose, Bld: 80 mg/dL (ref 70–99)
Potassium: 4.1 mEq/L (ref 3.5–5.1)
Sodium: 135 mEq/L (ref 135–145)
Total Bilirubin: 0.9 mg/dL (ref 0.2–1.2)
Total Protein: 7.4 g/dL (ref 6.0–8.3)

## 2022-11-13 LAB — AMYLASE: Amylase: 44 U/L (ref 27–131)

## 2022-11-13 LAB — LIPASE: Lipase: 34 U/L (ref 11.0–59.0)

## 2022-11-13 LAB — HIGH SENSITIVITY CRP: CRP, High Sensitivity: 28.08 mg/L — ABNORMAL HIGH (ref 0.000–5.000)

## 2022-11-13 LAB — SEDIMENTATION RATE: Sed Rate: 33 mm/hr — ABNORMAL HIGH (ref 0–20)

## 2022-11-13 MED ORDER — DEXLANSOPRAZOLE 60 MG PO CPDR: 60.0000 mg | DELAYED_RELEASE_CAPSULE | Freq: Every day | ORAL | 3 refills | Status: DC

## 2022-11-13 NOTE — Telephone Encounter (Signed)
Cbc, cmp, esr, crp Ok to move colon to Dec 7 at 3 PM

## 2022-11-13 NOTE — Telephone Encounter (Signed)
Pt was seen 2 days ago for his procedure

## 2022-11-13 NOTE — Telephone Encounter (Signed)
Lab orders entered along with lipase and amylase. Asked pt to call back if he want to move the colonoscopy date up.

## 2022-11-13 NOTE — Telephone Encounter (Addendum)
Patient called states he is still having a lot of abdominal pain and is seeking to get labs done. Michela Pitcher he is able to come in this afternoon if he needs to.

## 2022-11-13 NOTE — Telephone Encounter (Signed)
Call to pt to r/s colon for 12/8 at 3 pm, sheri had already called pt and r/s to 12/8 at 8 AM, pt states he prefers 8 AM, pt states he already has his suprep but does not have new instructions, let pt know I will send via my chart and he can call previsit if any questions, pt verb understanding.

## 2022-11-13 NOTE — Telephone Encounter (Signed)
Inbound call from patient stating he is experiencing epigastric pain since his procedure yesterday. He is requesting a call back.

## 2022-11-13 NOTE — Telephone Encounter (Signed)
Patient said he is experiencing epigastric pain that got worse this morning. Patient says it got up to a 7/10 and after taking levsin, mylanta and pepto, it is now down to a 1/10. He is eating some small snacks he says and wonders if the pain is from the change in his medication from [place] to Protonix. Please advise.

## 2022-11-13 NOTE — Telephone Encounter (Signed)
Moved patient to 8am on 11/20/22. Reviewed timing of his prep with him and he verbalized understanding.

## 2022-11-13 NOTE — Telephone Encounter (Signed)
Ok to go back to Dexilant 60 mg daily This should replace the pantoprazole

## 2022-11-13 NOTE — Telephone Encounter (Signed)
Returned the patient's call this afternoon and relayed to him that Dr. Hilarie Fredrickson suggested he go back on the Dexilant 60 mg daily and to stop the Pantoprazole. Will call this in to his pharmacy. He also wasa requesting that we possibly put him on the wait list for his Colonoscopy that is currently scheduled for Dec 18. Will notify schedulers about that.

## 2022-11-14 ENCOUNTER — Telehealth: Payer: Self-pay

## 2022-11-14 ENCOUNTER — Encounter: Payer: Self-pay | Admitting: Internal Medicine

## 2022-11-14 NOTE — Telephone Encounter (Signed)
Pt prescribed dexilant and states it needs a PA.

## 2022-11-16 ENCOUNTER — Encounter: Payer: Self-pay | Admitting: Certified Registered Nurse Anesthetist

## 2022-11-17 NOTE — Telephone Encounter (Signed)
Let pt know Dr. Norman Herrlich is out of the office and he states he will contact his PCP regarding this issue.

## 2022-11-18 DIAGNOSIS — R197 Diarrhea, unspecified: Secondary | ICD-10-CM | POA: Diagnosis not present

## 2022-11-20 ENCOUNTER — Encounter: Payer: Self-pay | Admitting: Internal Medicine

## 2022-11-20 ENCOUNTER — Ambulatory Visit (AMBULATORY_SURGERY_CENTER): Payer: 59 | Admitting: Internal Medicine

## 2022-11-20 VITALS — BP 110/64 | HR 60 | Temp 96.0°F | Resp 12 | Ht 69.0 in | Wt 154.0 lb

## 2022-11-20 DIAGNOSIS — K51319 Ulcerative (chronic) rectosigmoiditis with unspecified complications: Secondary | ICD-10-CM | POA: Diagnosis not present

## 2022-11-20 DIAGNOSIS — R933 Abnormal findings on diagnostic imaging of other parts of digestive tract: Secondary | ICD-10-CM

## 2022-11-20 DIAGNOSIS — K529 Noninfective gastroenteritis and colitis, unspecified: Secondary | ICD-10-CM | POA: Diagnosis not present

## 2022-11-20 DIAGNOSIS — J45909 Unspecified asthma, uncomplicated: Secondary | ICD-10-CM | POA: Diagnosis not present

## 2022-11-20 MED ORDER — SODIUM CHLORIDE 0.9 % IV SOLN: 500.0000 mL | Freq: Once | INTRAVENOUS | Status: DC

## 2022-11-20 NOTE — Progress Notes (Signed)
Report given to PACU, vss 

## 2022-11-20 NOTE — Patient Instructions (Signed)
Handout provided on hemorrhoids and ulcerative colitis.   Resume previous diet.  Continue present medications. Once pathology results reviewed will discuss escalation of therapy to better control IBD.  Await pathology results.  Repeat colonoscopy is recommended. The colonoscopy date will be determined after pathology results from today's exam become available for review.   YOU HAD AN ENDOSCOPIC PROCEDURE TODAY AT [facility]:   Refer to the procedure report that was given to you for any specific questions about what was found during the examination.  If the procedure report does not answer your questions, please call your gastroenterologist to clarify.  If you requested that your care partner not be given the details of your procedure findings, then the procedure report has been included in a sealed envelope for you to review at your convenience later.  YOU SHOULD EXPECT: Some feelings of bloating in the abdomen. Passage of more gas than usual.  Walking can help get rid of the air that was put into your GI tract during the procedure and reduce the bloating. If you had a lower endoscopy (such as a colonoscopy or flexible sigmoidoscopy) you may notice spotting of blood in your stool or on the toilet paper. If you underwent a bowel prep for your procedure, you may not have a normal bowel movement for a few days.  Please Note:  You might notice some irritation and congestion in your nose or some drainage.  This is from the oxygen used during your procedure.  There is no need for concern and it should clear up in a day or so.  SYMPTOMS TO REPORT IMMEDIATELY:  Following lower endoscopy (colonoscopy or flexible sigmoidoscopy):  Excessive amounts of blood in the stool  Significant tenderness or worsening of abdominal pains  Swelling of the abdomen that is new, acute  Fever of 100F or higher  For urgent or emergent issues, a gastroenterologist can be reached at any hour by calling [phone]  [phone]. Do not use MyChart messaging for urgent concerns.    DIET:  We do recommend a small meal at first, but then you may proceed to your regular diet.  Drink plenty of fluids but you should avoid alcoholic beverages for 24 hours.  ACTIVITY:  You should plan to take it easy for the rest of today and you should NOT DRIVE or use heavy machinery until tomorrow (because of the sedation medicines used during the test).    FOLLOW UP: Our staff will call the number listed on your records the next business day following your procedure.  We will call around 7:15- 8:00 am to check on you and address any questions or concerns that you may have regarding the information given to you following your procedure. If we do not reach you, we will leave a message.     If any biopsies were taken you will be contacted by phone or by letter within the next 1-3 weeks.  Please call us at [phone] if you have not heard about the biopsies in 3 weeks.    SIGNATURES/CONFIDENTIALITY: You and/or your care partner have signed paperwork which will be entered into your electronic medical record.  These signatures attest to the fact that that the information above on your After Visit Summary has been reviewed and is understood.  Full responsibility of the confidentiality of this discharge information lies with you and/or your care-partner.

## 2022-11-20 NOTE — Progress Notes (Signed)
GASTROENTEROLOGY PROCEDURE H&P NOTE   Primary Care Physician: Prince Solian, MD    Reason for Procedure:   Ulcerative proctosigmoiditis, recent abd pain, loose stools, abnl CT colon  Plan:    colonoscopy  Patient is appropriate for endoscopic procedure(s) in the ambulatory ([place]) setting.  The nature of the procedure, as well as the risks, benefits, and alternatives were carefully and thoroughly reviewed with the patient. Ample time for discussion and questions allowed. The patient understood, was satisfied, and agreed to proceed.     HPI: [name] is a 66 y.o. male who presents for colonoscopy.  Medical history as below.  Tolerated the prep.  No recent chest pain or shortness of breath.  No abdominal pain today.  Past Medical History:  Diagnosis Date   Arthritis 03/2020   R hip   Asthma    Elevated PSA    History of adenomatous polyp of colon    Hypercholesteremia    Monoallelic mutation of AVP gene    Ulcerative colitis     Past Surgical History:  Procedure Laterality Date   ATRIAL FIBRILLATION ABLATION     COLONOSCOPY     LEFT HEART CATH AND CORONARY ANGIOGRAPHY N/A 05/06/2018   Procedure: LEFT HEART CATH AND CORONARY ANGIOGRAPHY;  Surgeon: Adrian Prows, MD;  Location: [facility];  Service: Cardiovascular;  Laterality: N/A;    Prior to Admission medications   Medication Sig Start Date End Date Taking? Authorizing Provider  Cholecalciferol (VITAMIN D3) 125 MCG (5000 UT) TABS Take 5,000 tablets by mouth. Twice weekly.   Yes [provider]  fluticasone (FLONASE) 50 MCG/ACT nasal spray Place 1 spray into both nostrils daily.   Yes [provider]  fluticasone-salmeterol (ADVAIR) 100-50 MCG/ACT AEPB Inhale 1 puff into the lungs daily. 12/17/21  Yes   hyoscyamine (LEVSIN) 0.125 MG tablet Take 1-2 tablets (0.125-0.25 mg total) by mouth every 6 (six) hours as needed for cramping (cramping abdominal pain). 10/20/22  Yes Keiondre Colee, Lajuan Lines, MD   mesalamine (LIALDA) 1.2 g EC tablet Take 2 tablets (2.4 g total) by mouth in the morning and at bedtime. 11/10/22  Yes Francella Barnett, Lajuan Lines, MD  metoprolol succinate (TOPROL XL) 25 MG 24 hr tablet Take 1 and 1/2 tablets by mouth daily. 06/18/22  Yes   pantoprazole (PROTONIX) 40 MG tablet Take 40 mg by mouth daily.   Yes [provider]  pregabalin (LYRICA) 25 MG capsule Take 1 capsule by mouth Twice a day 30 days 05/10/21  Yes   rosuvastatin (CRESTOR) 20 MG tablet TAKE 1 TABLET BY MOUTH DAILY 05/29/22  Yes   albuterol (VENTOLIN HFA) 108 (90 Base) MCG/ACT inhaler Inhale 2 puffs into the lungs every 4 (four) hours as needed. 03/04/22     celecoxib (CELEBREX) 200 MG capsule Take 200 mg by mouth daily.    [provider]  COMIRNATY syringe  09/06/22   [provider]  COVID-19 mRNA vaccine, Moderna, 100 MCG/0.5ML injection Inject into the muscle. 05/15/21   Carlyle Basques, MD  dexlansoprazole (DEXILANT) 60 MG capsule Take 60 mg by mouth daily. Patient not taking: Reported on 11/20/2022    [provider]  dexlansoprazole (DEXILANT) 60 MG capsule Take 1 capsule (60 mg total) by mouth daily. Patient not taking: Reported on 11/20/2022 11/13/22   Jerene Bears, MD  diclofenac Sodium (VOLTAREN) 1 % GEL Apply 2 g topically to the affected area(s) 4 (four) times daily. 12/23/21     fluticasone-salmeterol (ADVAIR DISKUS) 100-50 MCG/ACT  AEPB Inhale 1 puff by mouth daily 07/24/20 11/11/22  Avva, Ravisankar, MD  levofloxacin (LEVAQUIN) 500 MG tablet Take 500 mg by mouth daily. 10/19/22   [provider]  methocarbamol (ROBAXIN) 500 MG tablet Take 1 tablet (500 mg total) by mouth every 8 (eight) hours. 08/26/22     metoprolol succinate (TOPROL-XL) 25 MG 24 hr tablet TAKE 1 & 1/2 TABLETS BY MOUTH DAILY ONCE A DAY 90 DAYS 02/27/21 02/27/22  Avva, Steva Ready, MD  nystatin cream (MYCOSTATIN) Apply 2-3 times to affected areas as directed External 30 days 04/08/21     nystatin cream (MYCOSTATIN)  Apply 2-3 times to affected areas as directed External 30 days 10/16/22     Tdap ([place]) 5-2.5-18.5 LF-MCG/0.5 injection Inject into the muscle once as directed 06/13/21   Carlyle Basques, MD  Zolpidem Tartrate 1.75 MG SUBL Place 1 tablet under the tongue once a night. 10/16/22     Zolpidem Tartrate 3.5 MG SUBL continuous as needed. Patient not taking: Reported on 11/11/2022 12/02/18   [provider]    Current Outpatient Medications  Medication Sig Dispense Refill   Cholecalciferol (VITAMIN D3) 125 MCG (5000 UT) TABS Take 5,000 tablets by mouth. Twice weekly.     fluticasone (FLONASE) 50 MCG/ACT nasal spray Place 1 spray into both nostrils daily.     fluticasone-salmeterol (ADVAIR) 100-50 MCG/ACT AEPB Inhale 1 puff into the lungs daily. 60 each 6   hyoscyamine (LEVSIN) 0.125 MG tablet Take 1-2 tablets (0.125-0.25 mg total) by mouth every 6 (six) hours as needed for cramping (cramping abdominal pain). 60 tablet 1   mesalamine (LIALDA) 1.2 g EC tablet Take 2 tablets (2.4 g total) by mouth in the morning and at bedtime. 360 tablet 1   metoprolol succinate (TOPROL XL) 25 MG 24 hr tablet Take 1 and 1/2 tablets by mouth daily. 135 tablet 3   pantoprazole (PROTONIX) 40 MG tablet Take 40 mg by mouth daily.     pregabalin (LYRICA) 25 MG capsule Take 1 capsule by mouth Twice a day 30 days 60 capsule 2   rosuvastatin (CRESTOR) 20 MG tablet TAKE 1 TABLET BY MOUTH DAILY 90 tablet 4   albuterol (VENTOLIN HFA) 108 (90 Base) MCG/ACT inhaler Inhale 2 puffs into the lungs every 4 (four) hours as needed. 18 g 3   celecoxib (CELEBREX) 200 MG capsule Take 200 mg by mouth daily.     COMIRNATY syringe      COVID-19 mRNA vaccine, Moderna, 100 MCG/0.5ML injection Inject into the muscle. 0.5 mL 0   dexlansoprazole (DEXILANT) 60 MG capsule Take 60 mg by mouth daily. (Patient not taking: Reported on 11/20/2022)     dexlansoprazole (DEXILANT) 60 MG capsule Take 1 capsule (60 mg total) by mouth daily. (Patient  not taking: Reported on 11/20/2022) 90 capsule 3   diclofenac Sodium (VOLTAREN) 1 % GEL Apply 2 g topically to the affected area(s) 4 (four) times daily. 100 g 5   fluticasone-salmeterol (ADVAIR DISKUS) 100-50 MCG/ACT AEPB Inhale 1 puff by mouth daily 60 each 4   levofloxacin (LEVAQUIN) 500 MG tablet Take 500 mg by mouth daily.     methocarbamol (ROBAXIN) 500 MG tablet Take 1 tablet (500 mg total) by mouth every 8 (eight) hours. 90 tablet 1   metoprolol succinate (TOPROL-XL) 25 MG 24 hr tablet TAKE 1 & 1/2 TABLETS BY MOUTH DAILY ONCE A DAY 90 DAYS 135 tablet 1   nystatin cream (MYCOSTATIN) Apply 2-3 times to affected areas as directed External 30 days 30  g 0   nystatin cream (MYCOSTATIN) Apply 2-3 times to affected areas as directed External 30 days 30 g 0   Tdap (BOOSTRIX) 5-2.5-18.5 LF-MCG/0.5 injection Inject into the muscle once as directed 0.5 mL 0   Zolpidem Tartrate 1.75 MG SUBL Place 1 tablet under the tongue once a night. 10 tablet 0   Zolpidem Tartrate 3.5 MG SUBL continuous as needed. (Patient not taking: Reported on 11/11/2022)     Current Facility-Administered Medications  Medication Dose Route Frequency Provider Last Rate Last Admin   0.9 %  sodium chloride infusion  500 mL Intravenous Once Leonard Hendler, Lajuan Lines, MD        Allergies as of 11/20/2022   (No Known Allergies)    Family History  Problem Relation Age of Onset   Heart disease Father    Colon cancer Neg Hx    Esophageal cancer Neg Hx    Stomach cancer Neg Hx    Rectal cancer Neg Hx     Social History   Socioeconomic History   Marital status: Married    Spouse name: Not on file   Number of children: 2   Years of education: Not on file   Highest education level: Not on file  Occupational History   Occupation: MD  Tobacco Use   Smoking status: Never   Smokeless tobacco: Never  Vaping Use   Vaping Use: Never used  Substance and Sexual Activity   Alcohol use: No   Drug use: No   Sexual activity: Not on file   Other Topics Concern   Not on file  Social History Narrative   Not on file   Social Determinants of Health   Financial Resource Strain: Not on file  Food Insecurity: Not on file  Transportation Needs: Not on file  Physical Activity: Not on file  Stress: Not on file  Social Connections: Not on file  Intimate Partner Violence: Not on file    Physical Exam: Vital signs in last 24 hours: @BP  131/75   Pulse (!) 54   Temp (!) 96 F (35.6 C)   Resp 10   Ht 5' 9"  (1.753 m)   Wt 154 lb (69.9 kg)   SpO2 100%   BMI 22.74 kg/m  GEN: NAD EYE: Sclerae anicteric ENT: MMM CV: Non-tachycardic Pulm: CTA b/l GI: Soft, NT/ND NEURO:  Alert & Oriented x 3   Zenovia Jarred, MD [facility]  11/20/2022 8:04 AM

## 2022-11-20 NOTE — Op Note (Signed)
[name] Patient Name: [name] Procedure Date: 11/20/2022 7:28 AM MRN: [id number] Endoscopist: Jerene Bears , MD, [id number] Age: 66 Referring MD:  Date of Birth: [birth year] Gender: Male Account #: [id number] Procedure:                Colonoscopy Indications:              Chronic ulcerative proctosigmoiditis, Abnormal CT                            of the GI tract (colitis near splenic flexure,                            acute), intermittent diarrhea, upper and left-sided                            abd pain, current therapy is Lialda 4.8 g daily                            (prior to flare was 1.2-2.4g daily); recently                            rather unremarkable EGD (mild chronic gastritis                            without H. Pylori) Medicines:                Monitored Anesthesia Care Procedure:                Pre-Anesthesia Assessment:                           - Prior to the procedure, a History and Physical                            was performed, and patient medications and                            allergies were reviewed. The patient's tolerance of                            previous anesthesia was also reviewed. The risks                            and benefits of the procedure and the sedation                            options and risks were discussed with the patient.                            All questions were answered, and informed consent                            was obtained. Prior Anticoagulants: The patient has  taken no anticoagulant or antiplatelet agents. ASA                            Grade Assessment: II - A patient with mild systemic                            disease. After reviewing the risks and benefits,                            the patient was deemed in satisfactory condition to                            undergo the procedure.                           After obtaining informed consent, the colonoscope                             was passed under direct vision. Throughout the                            procedure, the patient's blood pressure, pulse, and                            oxygen saturations were monitored continuously. The                            PCF-HQ190L Colonoscope was introduced through the                            anus and advanced to the terminal ileum. The                            colonoscopy was performed without difficulty. The                            patient tolerated the procedure well. The quality                            of the bowel preparation was good. The terminal                            ileum, ileocecal valve, appendiceal orifice, and                            rectum were photographed. Scope In: 8:06:56 AM Scope Out: 8:24:50 AM Scope Withdrawal Time: 0 hours 14 minutes 12 seconds  Total Procedure Duration: 0 hours 17 minutes 54 seconds  Findings:                 The digital rectal exam was normal.                           The terminal ileum appeared normal.  Inflammation characterized by altered vascularity,                            erythema, granularity and loss of vascularity was                            found in a continuous and circumferential pattern                            from the rectum to the sigmoid colon. The                            inflammation was mild in severity                            (rectum/rectosigmoid junction) and moderate                            (sigmoid) in severity. Biopsies were taken with a                            cold forceps for histology (sigmoid jar and rectum                            jar).                           Normal mucosa was found from cecum to descending                            colon. Biopsies were taken with a cold forceps for                            histology (cecum/ascending/transverse jar,                            descending jar).                            Internal hemorrhoids were found during                            retroflexion. The hemorrhoids were small. Complications:            No immediate complications. Estimated Blood Loss:     Estimated blood loss was minimal. Impression:               - The examined portion of the ileum was normal.                           - Proctosigmoid ulcerative colitis. Inflammation                            was found from the rectum to the sigmoid colon.  This was mild in severity to moderate in severity.                            Biopsied.                           - Normal mucosa from cecum to descending colon.                            Biopsied.                           - Small internal hemorrhoids. Recommendation:           - Patient has a contact number available for                            emergencies. The signs and symptoms of potential                            delayed complications were discussed with the                            patient. Return to normal activities tomorrow.                            Written discharge instructions were provided to the                            patient.                           - Resume previous diet.                           - Continue present medications. Once pathology                            results reviewed with discuss escalation of therapy                            to better control IBD.                           - Await pathology results.                           - Repeat colonoscopy is recommended. The                            colonoscopy date will be determined after pathology                            results from today's exam become available for                            review. Jerene Bears, MD 11/20/2022  8:36:03 AM This report has been signed electronically.

## 2022-11-21 ENCOUNTER — Telehealth: Payer: Self-pay | Admitting: *Deleted

## 2022-11-21 NOTE — Telephone Encounter (Signed)
Attempted to call patient for their post-procedure follow-up call. No answer. Left voicemail.   

## 2022-11-26 ENCOUNTER — Encounter: Payer: Self-pay | Admitting: Internal Medicine

## 2022-11-26 ENCOUNTER — Telehealth: Payer: Self-pay | Admitting: Pharmacy Technician

## 2022-11-26 ENCOUNTER — Other Ambulatory Visit (HOSPITAL_COMMUNITY): Payer: Self-pay

## 2022-11-26 NOTE — Telephone Encounter (Signed)
Patient Advocate Encounter  Received notification from [facility] that prior authorization for DEXILANT 60MG is required.   PA submitted on 12.13.23 Key [id number] Status is pending    Luciano Cutter, CPhT Patient Advocate Phone: [phone]

## 2022-11-26 NOTE — Telephone Encounter (Signed)
Discussed with patient by phone, see result note attached to pathology result post colonoscopy

## 2022-11-26 NOTE — Telephone Encounter (Signed)
PA has been submitted and telephone encounter has been created

## 2022-11-26 NOTE — Telephone Encounter (Signed)
Patient Advocate Encounter  Prior Authorization for DEXLANSOPRAZOLE 60MG has been approved.    PA#  [id number] Effective dates: 12.13.23 through 12.12.24  Joie Reamer B. CPhT P: [phone] F: [phone]

## 2022-11-27 ENCOUNTER — Other Ambulatory Visit (HOSPITAL_COMMUNITY): Payer: Self-pay

## 2022-11-27 ENCOUNTER — Other Ambulatory Visit: Payer: Self-pay

## 2022-11-27 DIAGNOSIS — K51319 Ulcerative (chronic) rectosigmoiditis with unspecified complications: Secondary | ICD-10-CM

## 2022-11-27 MED ORDER — MESALAMINE 4 G RE ENEM
4.0000 g | ENEMA | Freq: Every day | RECTAL | 1 refills | Status: DC
  Filled 2022-11-27: qty 1680, 28d supply, fill #0

## 2022-11-28 ENCOUNTER — Other Ambulatory Visit (HOSPITAL_COMMUNITY): Payer: Self-pay

## 2022-12-01 ENCOUNTER — Encounter: Payer: 59 | Admitting: Internal Medicine

## 2022-12-03 ENCOUNTER — Other Ambulatory Visit (HOSPITAL_COMMUNITY): Payer: Self-pay

## 2022-12-03 DIAGNOSIS — J328 Other chronic sinusitis: Secondary | ICD-10-CM | POA: Diagnosis not present

## 2022-12-03 DIAGNOSIS — J309 Allergic rhinitis, unspecified: Secondary | ICD-10-CM | POA: Diagnosis not present

## 2022-12-03 MED ORDER — PREDNISONE 10 MG PO TABS
10.0000 mg | ORAL_TABLET | ORAL | 0 refills | Status: DC
  Filled 2022-12-03 – 2023-02-09 (×2): qty 15, 6d supply, fill #0

## 2022-12-03 NOTE — Telephone Encounter (Signed)
Per pt he need PA for Dexilant 6omg

## 2022-12-05 ENCOUNTER — Other Ambulatory Visit (HOSPITAL_COMMUNITY): Payer: Self-pay

## 2022-12-09 ENCOUNTER — Other Ambulatory Visit: Payer: Self-pay

## 2022-12-09 ENCOUNTER — Other Ambulatory Visit (HOSPITAL_BASED_OUTPATIENT_CLINIC_OR_DEPARTMENT_OTHER): Payer: Self-pay

## 2022-12-09 ENCOUNTER — Other Ambulatory Visit (HOSPITAL_COMMUNITY): Payer: Self-pay

## 2022-12-09 MED ORDER — DESMOPRESSIN ACETATE 0.2 MG PO TABS
0.2000 mg | ORAL_TABLET | Freq: Two times a day (BID) | ORAL | 12 refills | Status: AC
  Filled 2022-12-09: qty 60, 30d supply, fill #0
  Filled 2023-05-14 – 2023-05-15 (×2): qty 60, 30d supply, fill #1
  Filled 2023-07-01 – 2023-07-06 (×2): qty 60, 30d supply, fill #2
  Filled 2023-08-11: qty 60, 30d supply, fill #3

## 2022-12-09 MED ORDER — PREGABALIN 25 MG PO CAPS
25.0000 mg | ORAL_CAPSULE | Freq: Two times a day (BID) | ORAL | 0 refills | Status: DC
  Filled 2022-12-09: qty 60, 30d supply, fill #0
  Filled 2022-12-09 (×2): qty 30, 15d supply, fill #0

## 2022-12-10 ENCOUNTER — Other Ambulatory Visit (HOSPITAL_COMMUNITY): Payer: Self-pay

## 2022-12-10 ENCOUNTER — Other Ambulatory Visit: Payer: Self-pay

## 2022-12-10 DIAGNOSIS — L600 Ingrowing nail: Secondary | ICD-10-CM | POA: Diagnosis not present

## 2022-12-10 DIAGNOSIS — E232 Diabetes insipidus: Secondary | ICD-10-CM | POA: Diagnosis not present

## 2022-12-10 MED ORDER — MUPIROCIN 2 % EX OINT
TOPICAL_OINTMENT | Freq: Two times a day (BID) | CUTANEOUS | 1 refills | Status: DC
  Filled 2022-12-10: qty 22, 5d supply, fill #0

## 2022-12-10 NOTE — Telephone Encounter (Signed)
This has been resolved

## 2022-12-12 ENCOUNTER — Other Ambulatory Visit (HOSPITAL_COMMUNITY): Payer: Self-pay

## 2022-12-12 MED ORDER — SULFAMETHOXAZOLE-TRIMETHOPRIM 800-160 MG PO TABS
1.0000 | ORAL_TABLET | Freq: Two times a day (BID) | ORAL | 0 refills | Status: DC
  Filled 2022-12-12: qty 14, 7d supply, fill #0

## 2022-12-17 ENCOUNTER — Other Ambulatory Visit (HOSPITAL_COMMUNITY): Payer: Self-pay

## 2022-12-18 ENCOUNTER — Other Ambulatory Visit (HOSPITAL_COMMUNITY): Payer: Self-pay

## 2022-12-18 DIAGNOSIS — H8112 Benign paroxysmal vertigo, left ear: Secondary | ICD-10-CM | POA: Diagnosis not present

## 2022-12-18 DIAGNOSIS — J329 Chronic sinusitis, unspecified: Secondary | ICD-10-CM | POA: Diagnosis not present

## 2022-12-18 DIAGNOSIS — M26609 Unspecified temporomandibular joint disorder, unspecified side: Secondary | ICD-10-CM | POA: Diagnosis not present

## 2022-12-19 ENCOUNTER — Other Ambulatory Visit: Payer: Self-pay | Admitting: Otolaryngology

## 2022-12-19 DIAGNOSIS — H8112 Benign paroxysmal vertigo, left ear: Secondary | ICD-10-CM

## 2022-12-19 DIAGNOSIS — M26609 Unspecified temporomandibular joint disorder, unspecified side: Secondary | ICD-10-CM

## 2022-12-19 DIAGNOSIS — J329 Chronic sinusitis, unspecified: Secondary | ICD-10-CM

## 2022-12-27 ENCOUNTER — Other Ambulatory Visit (HOSPITAL_COMMUNITY): Payer: Self-pay

## 2022-12-29 ENCOUNTER — Other Ambulatory Visit (HOSPITAL_COMMUNITY): Payer: Self-pay

## 2022-12-29 MED ORDER — CELECOXIB 100 MG PO CAPS
100.0000 mg | ORAL_CAPSULE | Freq: Every day | ORAL | 0 refills | Status: DC
  Filled 2022-12-29 – 2023-04-19 (×2): qty 30, 30d supply, fill #0

## 2022-12-30 ENCOUNTER — Encounter: Payer: Self-pay | Admitting: Internal Medicine

## 2023-01-12 ENCOUNTER — Other Ambulatory Visit (HOSPITAL_COMMUNITY): Payer: Self-pay

## 2023-01-12 ENCOUNTER — Other Ambulatory Visit: Payer: 59

## 2023-01-12 DIAGNOSIS — K51319 Ulcerative (chronic) rectosigmoiditis with unspecified complications: Secondary | ICD-10-CM | POA: Diagnosis not present

## 2023-01-12 MED ORDER — VICKS VAPORUB 4.73-1.2-2.6 % EX OINT: TOPICAL_OINTMENT | CUTANEOUS | 0 refills | Status: DC

## 2023-01-12 MED ORDER — SULFAMETHOXAZOLE-TRIMETHOPRIM 800-160 MG PO TABS
1.0000 | ORAL_TABLET | Freq: Two times a day (BID) | ORAL | 0 refills | Status: DC
  Filled 2023-01-12: qty 10, 5d supply, fill #0

## 2023-01-13 ENCOUNTER — Other Ambulatory Visit (HOSPITAL_COMMUNITY): Payer: Self-pay

## 2023-01-15 ENCOUNTER — Ambulatory Visit
Admission: RE | Admit: 2023-01-15 | Discharge: 2023-01-15 | Disposition: A | Discharge status: Home / Self Care | Payer: 59 | Source: Ambulatory Visit | Attending: Otolaryngology | Admitting: Otolaryngology

## 2023-01-15 DIAGNOSIS — J342 Deviated nasal septum: Secondary | ICD-10-CM | POA: Diagnosis not present

## 2023-01-15 DIAGNOSIS — M26609 Unspecified temporomandibular joint disorder, unspecified side: Secondary | ICD-10-CM

## 2023-01-15 DIAGNOSIS — J329 Chronic sinusitis, unspecified: Secondary | ICD-10-CM

## 2023-01-15 DIAGNOSIS — H8112 Benign paroxysmal vertigo, left ear: Secondary | ICD-10-CM

## 2023-01-15 DIAGNOSIS — K011 Impacted teeth: Secondary | ICD-10-CM | POA: Diagnosis not present

## 2023-01-15 DIAGNOSIS — H8110 Benign paroxysmal vertigo, unspecified ear: Secondary | ICD-10-CM | POA: Diagnosis not present

## 2023-01-15 DIAGNOSIS — J341 Cyst and mucocele of nose and nasal sinus: Secondary | ICD-10-CM | POA: Diagnosis not present

## 2023-01-16 LAB — CALPROTECTIN, FECAL: Calprotectin, Fecal: 61 ug/g (ref 0–120)

## 2023-01-19 ENCOUNTER — Other Ambulatory Visit (HOSPITAL_COMMUNITY): Payer: Self-pay

## 2023-01-19 MED ORDER — SULFAMETHOXAZOLE-TRIMETHOPRIM 800-160 MG PO TABS
1.0000 | ORAL_TABLET | Freq: Two times a day (BID) | ORAL | 0 refills | Status: DC
  Filled 2023-01-19: qty 10, 5d supply, fill #0

## 2023-02-05 DIAGNOSIS — J330 Polyp of nasal cavity: Secondary | ICD-10-CM | POA: Diagnosis not present

## 2023-02-05 DIAGNOSIS — J329 Chronic sinusitis, unspecified: Secondary | ICD-10-CM | POA: Diagnosis not present

## 2023-02-05 DIAGNOSIS — H2513 Age-related nuclear cataract, bilateral: Secondary | ICD-10-CM | POA: Diagnosis not present

## 2023-02-05 DIAGNOSIS — H5213 Myopia, bilateral: Secondary | ICD-10-CM | POA: Diagnosis not present

## 2023-02-10 ENCOUNTER — Encounter: Payer: Self-pay | Admitting: Pharmacist

## 2023-02-10 ENCOUNTER — Other Ambulatory Visit: Payer: Self-pay

## 2023-02-10 ENCOUNTER — Other Ambulatory Visit (HOSPITAL_COMMUNITY): Payer: Self-pay

## 2023-02-12 ENCOUNTER — Encounter: Payer: Self-pay | Admitting: Internal Medicine

## 2023-02-12 ENCOUNTER — Other Ambulatory Visit: Payer: Self-pay

## 2023-02-12 MED ORDER — SUCRALFATE 1 GM/10ML PO SUSP
1.0000 g | Freq: Four times a day (QID) | ORAL | 1 refills | Status: DC
  Filled 2023-03-16: qty 420, 11d supply, fill #0

## 2023-02-12 NOTE — Telephone Encounter (Signed)
Ok to send carafate 1 g TIDAC and HS x 2-4 weeks JMP

## 2023-02-18 ENCOUNTER — Other Ambulatory Visit: Payer: Self-pay

## 2023-02-23 ENCOUNTER — Ambulatory Visit (INDEPENDENT_AMBULATORY_CARE_PROVIDER_SITE_OTHER): Payer: 59 | Admitting: Internal Medicine

## 2023-02-23 ENCOUNTER — Encounter: Payer: Self-pay | Admitting: *Deleted

## 2023-02-23 VITALS — BP 124/68 | HR 76 | Ht 69.0 in | Wt 143.2 lb

## 2023-02-23 DIAGNOSIS — K5150 Left sided colitis without complications: Secondary | ICD-10-CM

## 2023-02-23 DIAGNOSIS — R1013 Epigastric pain: Secondary | ICD-10-CM

## 2023-02-23 MED ORDER — DEXLANSOPRAZOLE 60 MG PO CPDR
60.0000 mg | DELAYED_RELEASE_CAPSULE | Freq: Every day | ORAL | 3 refills | Status: DC
  Filled 2023-04-13 – 2023-04-19 (×2): qty 90, 90d supply, fill #0
  Filled 2023-07-20: qty 90, 90d supply, fill #1

## 2023-02-23 NOTE — Progress Notes (Signed)
Subjective:    Patient ID: [name], male    DOB: November 12, 1956, 67 y.o.   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  He is here alone today.  He was last seen for colonoscopy and upper endoscopy in December 2023.  Colonoscopy revealed mild inflammation from rectum to sigmoid.  Biopsies confirmed chronic active inflammation in the sigmoid.  The colonic mucosa was normal from the descending colon to cecum and biopsies were also normal. Upper endoscopy revealed mild striped gastritis biopsied showed chronic inflammation but no H. pylori or abnormal cells.  He has been taking Lialda 4.8 g daily.  He had briefly gone down to 3 tablets daily but because of some recurrent left-sided abdominal discomfort he is now back to 4.8 g daily.  He notices stools to be formed, once daily without mucus or blood.  His biggest complaint is that he developed epigastric abdominal pain over the last few days.  He had weaned off of Anthony after seeing me in December 2023 but restarted this when he was prescribed a Medrol Dosepak for sinusitis by Dr. Constance Holster on 02/04/2022.  About 7 days into steroid therapy he began to have epigastric pain.  Is worse 1 to 2 hours after eating.  For the last 4 to 5 days he has had decreased appetite and trying to eat a bland diet.  He is not having any heartburn.  He ran out of Dexilant and has used a couple days of pantoprazole.  He is taking Pepcid as needed for epigastric pain as well as Mylanta.  Mylanta helps a little bit.  Levsin he tried for this pain but it has not helped but it did cause some difficulty urinating.  He never picked up or tried sucralfate.  Several days after being off of the Medrol Dosepak he received an 80 mg Depo dose of methylprednisolone.  Review of Systems As  per HPI, otherwise negative  Current Medications, Allergies, Past Medical History, Past Surgical History, Family History and Social History were reviewed in [employer] record.    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   Epigastric left upper quadrant pain --most likely this is gastroduodenitis associated with steroid.  Interestingly he had been on PPI since starting steroids which should have been protective.  With his history of left-sided ulcerative colitis we need to make sure this is not the cause of the pain as opposed to more proximal inflammation.  We discussed this today. -- Continue Dexilant 60 mg daily; refill provided -- Begin sucralfate 1 g 3 times daily before meals and at bedtime for 1 to 2 weeks -- Avoid NSAIDs and additional steroids at this time if possible -- Bland low acid diet -- Could consider repeat upper endoscopy versus imaging for persistent symptoms  2.  Left-sided colitis --on mesalamine -- Continue mesalamine 4.8 g daily which had been helping alleviate his symptoms prior to the last couple of weeks -- Repeat fecal calprotectin to assess objectively for inflammation  30 minutes total spent today including patient facing time, coordination of care, reviewing medical history/procedures/pertinent radiology studies, and documentation of the encounter.

## 2023-02-23 NOTE — Patient Instructions (Addendum)
Your provider has requested that you go to the basement level for lab work before leaving today. Press "B" on the elevator. The lab is located at the first door on the left as you exit the elevator.  Please pick up the carafate previously sent to your pharmacy. Take 1 gram three times daily before meals and at bedtime.  We have sent the following medications to your pharmacy for you to pick up at your convenience: Dexilant  Discontinue Levsin/hyoscyamine.  Discontinue pantoprazole.  _______________________________________________________  If your blood pressure at your visit was 140/90 or greater, please contact your primary care physician to follow up on this.  _______________________________________________________  If you are age 32 or older, your body mass index should be between 23-30. Your Body mass index is 21.15 kg/m. If this is out of the aforementioned range listed, please consider follow up with your Primary Care Provider.  If you are age 93 or younger, your body mass index should be between 19-25. Your Body mass index is 21.15 kg/m. If this is out of the aformentioned range listed, please consider follow up with your Primary Care Provider.   ________________________________________________________  The [facility] providers would like to encourage you to use [facility] to communicate with providers for non-urgent requests or questions.  Due to long hold times on the telephone, sending your provider a message by [facility] may be a faster and more efficient way to get a response.  Please allow 48 business hours for a response.  Please remember that this is for non-urgent requests.  _______________________________________________________  Due to recent changes in healthcare laws, you may see the results of your imaging and laboratory studies on MyChart before your provider has had a chance to review them.  We understand that in some cases there may be results that are confusing or  concerning to you. Not all laboratory results come back in the same time frame and the provider may be waiting for multiple results in order to interpret others.  Please give [place] 48 hours in order for your provider to thoroughly review all the results before contacting the office for clarification of your results.

## 2023-02-24 ENCOUNTER — Other Ambulatory Visit: Payer: Self-pay

## 2023-02-24 DIAGNOSIS — K5150 Left sided colitis without complications: Secondary | ICD-10-CM

## 2023-03-01 LAB — SPECIMEN STATUS REPORT

## 2023-03-01 LAB — CALPROTECTIN, FECAL: Calprotectin, Fecal: 92 ug/g (ref 0–120)

## 2023-03-02 ENCOUNTER — Encounter: Payer: Self-pay | Admitting: Internal Medicine

## 2023-03-05 ENCOUNTER — Other Ambulatory Visit: Payer: Self-pay

## 2023-03-06 ENCOUNTER — Other Ambulatory Visit: Payer: Self-pay

## 2023-03-06 MED ORDER — AZELASTINE HCL 0.1 % NA SOLN
Freq: Two times a day (BID) | NASAL | 2 refills | Status: AC
  Filled 2023-03-06: qty 30, 30d supply, fill #0
  Filled 2023-04-19: qty 30, 30d supply, fill #1
  Filled 2023-05-17: qty 30, 30d supply, fill #2

## 2023-03-13 ENCOUNTER — Other Ambulatory Visit: Payer: Self-pay

## 2023-03-16 ENCOUNTER — Other Ambulatory Visit (HOSPITAL_COMMUNITY): Payer: Self-pay

## 2023-03-17 ENCOUNTER — Other Ambulatory Visit: Payer: Self-pay

## 2023-03-17 ENCOUNTER — Encounter: Payer: Self-pay | Admitting: Internal Medicine

## 2023-03-17 DIAGNOSIS — R1011 Right upper quadrant pain: Secondary | ICD-10-CM

## 2023-03-17 NOTE — Telephone Encounter (Signed)
RUQ [place], r/o gallstones

## 2023-03-18 ENCOUNTER — Ambulatory Visit: Admitting: Physician Assistant

## 2023-03-19 ENCOUNTER — Other Ambulatory Visit: Payer: Self-pay

## 2023-03-19 DIAGNOSIS — M79671 Pain in right foot: Secondary | ICD-10-CM | POA: Diagnosis not present

## 2023-03-23 ENCOUNTER — Other Ambulatory Visit: Payer: Self-pay

## 2023-03-24 ENCOUNTER — Ambulatory Visit (HOSPITAL_COMMUNITY): Payer: 59

## 2023-04-05 ENCOUNTER — Other Ambulatory Visit (HOSPITAL_COMMUNITY): Payer: Self-pay

## 2023-04-13 ENCOUNTER — Other Ambulatory Visit: Payer: Self-pay

## 2023-04-13 ENCOUNTER — Other Ambulatory Visit: Payer: Self-pay | Admitting: Internal Medicine

## 2023-04-13 ENCOUNTER — Encounter (HOSPITAL_COMMUNITY): Payer: Self-pay

## 2023-04-13 ENCOUNTER — Other Ambulatory Visit (HOSPITAL_COMMUNITY): Payer: Self-pay

## 2023-04-13 MED ORDER — SUCRALFATE 1 GM/10ML PO SUSP
1.0000 g | Freq: Four times a day (QID) | ORAL | 1 refills | Status: DC
  Filled 2023-04-13: qty 420, 11d supply, fill #0
  Filled 2023-05-14: qty 420, 10d supply, fill #1
  Filled 2023-05-15 (×4): qty 420, 11d supply, fill #0

## 2023-04-16 ENCOUNTER — Other Ambulatory Visit: Payer: Self-pay

## 2023-04-20 ENCOUNTER — Other Ambulatory Visit: Payer: Self-pay

## 2023-04-21 DIAGNOSIS — R052 Subacute cough: Secondary | ICD-10-CM | POA: Diagnosis not present

## 2023-04-21 DIAGNOSIS — J300 Vasomotor rhinitis: Secondary | ICD-10-CM | POA: Diagnosis not present

## 2023-05-15 ENCOUNTER — Other Ambulatory Visit: Payer: Self-pay

## 2023-05-15 ENCOUNTER — Other Ambulatory Visit (HOSPITAL_COMMUNITY): Payer: Self-pay

## 2023-05-15 DIAGNOSIS — M25552 Pain in left hip: Secondary | ICD-10-CM | POA: Diagnosis not present

## 2023-05-17 ENCOUNTER — Other Ambulatory Visit: Payer: Self-pay | Admitting: Internal Medicine

## 2023-05-18 ENCOUNTER — Other Ambulatory Visit: Payer: Self-pay

## 2023-05-18 MED ORDER — MESALAMINE 1.2 G PO TBEC
2.4000 g | DELAYED_RELEASE_TABLET | Freq: Two times a day (BID) | ORAL | 1 refills | Status: DC
  Filled 2023-05-18: qty 240, 60d supply, fill #0
  Filled 2023-07-20: qty 360, 90d supply, fill #1

## 2023-05-19 ENCOUNTER — Encounter: Payer: Self-pay | Admitting: Internal Medicine

## 2023-05-20 ENCOUNTER — Other Ambulatory Visit: Payer: Self-pay

## 2023-05-20 DIAGNOSIS — R1011 Right upper quadrant pain: Secondary | ICD-10-CM

## 2023-05-22 ENCOUNTER — Other Ambulatory Visit: Payer: 59

## 2023-06-08 ENCOUNTER — Ambulatory Visit: Payer: 59

## 2023-06-08 DIAGNOSIS — R1011 Right upper quadrant pain: Secondary | ICD-10-CM

## 2023-06-11 ENCOUNTER — Encounter: Payer: Self-pay | Admitting: Internal Medicine

## 2023-06-11 LAB — CALPROTECTIN, FECAL: Calprotectin, Fecal: 13 ug/g (ref 0–120)

## 2023-06-14 ENCOUNTER — Other Ambulatory Visit: Payer: Self-pay

## 2023-06-14 ENCOUNTER — Other Ambulatory Visit: Payer: Self-pay | Admitting: Internal Medicine

## 2023-06-15 ENCOUNTER — Other Ambulatory Visit: Payer: Self-pay

## 2023-06-15 ENCOUNTER — Other Ambulatory Visit (HOSPITAL_COMMUNITY): Payer: Self-pay

## 2023-06-15 MED ORDER — AZELASTINE HCL 137 MCG/SPRAY NA SOLN
1.0000 | Freq: Two times a day (BID) | NASAL | 5 refills | Status: DC
  Filled 2023-06-21: qty 30, fill #0
  Filled 2023-06-22: qty 30, 30d supply, fill #0
  Filled 2023-07-20: qty 30, 30d supply, fill #1

## 2023-06-15 MED ORDER — METOPROLOL SUCCINATE ER 25 MG PO TB24
37.5000 mg | ORAL_TABLET | Freq: Every day | ORAL | 1 refills | Status: DC
  Filled 2023-06-15: qty 135, 90d supply, fill #0
  Filled 2023-09-08: qty 135, 90d supply, fill #1
  Filled 2023-09-10: qty 45, 30d supply, fill #1
  Filled 2023-10-12: qty 45, 30d supply, fill #2

## 2023-06-15 MED ORDER — SUCRALFATE 1 GM/10ML PO SUSP
1.0000 g | Freq: Four times a day (QID) | ORAL | 0 refills | Status: DC
  Filled 2023-06-15 (×2): qty 420, 11d supply, fill #0

## 2023-06-22 ENCOUNTER — Other Ambulatory Visit: Payer: Self-pay

## 2023-06-22 ENCOUNTER — Other Ambulatory Visit (HOSPITAL_COMMUNITY): Payer: Self-pay

## 2023-06-30 DIAGNOSIS — R972 Elevated prostate specific antigen [PSA]: Secondary | ICD-10-CM | POA: Diagnosis not present

## 2023-06-30 DIAGNOSIS — I2510 Atherosclerotic heart disease of native coronary artery without angina pectoris: Secondary | ICD-10-CM | POA: Diagnosis not present

## 2023-06-30 DIAGNOSIS — E785 Hyperlipidemia, unspecified: Secondary | ICD-10-CM | POA: Diagnosis not present

## 2023-06-30 DIAGNOSIS — K5190 Ulcerative colitis, unspecified, without complications: Secondary | ICD-10-CM | POA: Diagnosis not present

## 2023-06-30 DIAGNOSIS — E232 Diabetes insipidus: Secondary | ICD-10-CM | POA: Diagnosis not present

## 2023-06-30 DIAGNOSIS — R0981 Nasal congestion: Secondary | ICD-10-CM | POA: Diagnosis not present

## 2023-06-30 DIAGNOSIS — Z0000 Encounter for general adult medical examination without abnormal findings: Secondary | ICD-10-CM | POA: Diagnosis not present

## 2023-06-30 DIAGNOSIS — K219 Gastro-esophageal reflux disease without esophagitis: Secondary | ICD-10-CM | POA: Diagnosis not present

## 2023-06-30 DIAGNOSIS — M5136 Other intervertebral disc degeneration, lumbar region: Secondary | ICD-10-CM | POA: Diagnosis not present

## 2023-06-30 DIAGNOSIS — D352 Benign neoplasm of pituitary gland: Secondary | ICD-10-CM | POA: Diagnosis not present

## 2023-07-01 ENCOUNTER — Other Ambulatory Visit: Payer: Self-pay

## 2023-07-02 ENCOUNTER — Other Ambulatory Visit: Payer: Self-pay | Admitting: Oncology

## 2023-07-02 ENCOUNTER — Other Ambulatory Visit (HOSPITAL_COMMUNITY): Payer: Self-pay

## 2023-07-02 ENCOUNTER — Other Ambulatory Visit: Payer: Self-pay

## 2023-07-02 ENCOUNTER — Encounter: Payer: Self-pay | Admitting: Pharmacist

## 2023-07-02 DIAGNOSIS — Z006 Encounter for examination for normal comparison and control in clinical research program: Secondary | ICD-10-CM

## 2023-07-02 MED ORDER — CELECOXIB 100 MG PO CAPS
100.0000 mg | ORAL_CAPSULE | Freq: Every day | ORAL | 0 refills | Status: DC
  Filled 2023-07-02 (×3): qty 30, 30d supply, fill #0

## 2023-07-02 MED ORDER — ROSUVASTATIN CALCIUM 20 MG PO TABS
20.0000 mg | ORAL_TABLET | Freq: Every day | ORAL | 4 refills | Status: AC
  Filled 2023-07-02 (×3): qty 90, 90d supply, fill #0

## 2023-07-03 ENCOUNTER — Other Ambulatory Visit: Payer: Self-pay

## 2023-07-06 ENCOUNTER — Other Ambulatory Visit: Payer: Self-pay

## 2023-07-07 ENCOUNTER — Other Ambulatory Visit: Payer: Self-pay

## 2023-07-14 DIAGNOSIS — M79605 Pain in left leg: Secondary | ICD-10-CM | POA: Diagnosis not present

## 2023-07-20 ENCOUNTER — Other Ambulatory Visit: Payer: Self-pay | Admitting: Internal Medicine

## 2023-07-20 ENCOUNTER — Other Ambulatory Visit (HOSPITAL_COMMUNITY): Payer: Self-pay

## 2023-07-20 ENCOUNTER — Other Ambulatory Visit: Payer: Self-pay

## 2023-07-20 MED ORDER — DICLOFENAC SODIUM 1 % EX GEL
2.0000 g | Freq: Four times a day (QID) | CUTANEOUS | 5 refills | Status: AC
  Filled 2023-07-20: qty 100, 13d supply, fill #0
  Filled 2023-08-11: qty 100, 13d supply, fill #1

## 2023-07-20 MED ORDER — SUCRALFATE 1 GM/10ML PO SUSP
1.0000 g | Freq: Four times a day (QID) | ORAL | 1 refills | Status: DC
  Filled 2023-07-20: qty 420, 11d supply, fill #0
  Filled 2023-08-11: qty 420, 11d supply, fill #1

## 2023-07-20 NOTE — Telephone Encounter (Signed)
Yes, thanks!!

## 2023-07-28 DIAGNOSIS — Z8739 Personal history of other diseases of the musculoskeletal system and connective tissue: Secondary | ICD-10-CM | POA: Diagnosis not present

## 2023-07-28 DIAGNOSIS — R200 Anesthesia of skin: Secondary | ICD-10-CM | POA: Diagnosis not present

## 2023-07-30 ENCOUNTER — Other Ambulatory Visit (HOSPITAL_COMMUNITY): Payer: Self-pay

## 2023-07-30 ENCOUNTER — Encounter (HOSPITAL_COMMUNITY): Payer: Self-pay

## 2023-07-30 ENCOUNTER — Other Ambulatory Visit: Payer: Self-pay

## 2023-07-30 MED ORDER — IPRATROPIUM BROMIDE 0.06 % NA SOLN
2.0000 | Freq: Three times a day (TID) | NASAL | 5 refills | Status: AC
  Filled 2023-07-30: qty 15, 25d supply, fill #0

## 2023-07-30 MED ORDER — CELECOXIB 100 MG PO CAPS
100.0000 mg | ORAL_CAPSULE | Freq: Two times a day (BID) | ORAL | 0 refills | Status: DC
  Filled 2023-07-30 (×2): qty 180, 90d supply, fill #0

## 2023-07-31 ENCOUNTER — Other Ambulatory Visit: Payer: Self-pay

## 2023-07-31 DIAGNOSIS — R202 Paresthesia of skin: Secondary | ICD-10-CM

## 2023-08-03 ENCOUNTER — Ambulatory Visit (INDEPENDENT_AMBULATORY_CARE_PROVIDER_SITE_OTHER): Payer: 59 | Admitting: Neurology

## 2023-08-03 DIAGNOSIS — R202 Paresthesia of skin: Secondary | ICD-10-CM

## 2023-08-03 NOTE — Procedures (Signed)
[facility]  [address], [address]  [place], [place] [address] Tel: [phone] Fax: [phone] Test Date:  08/03/2023  Patient: [name] DOB: 05/09/1956 Physician: Jacquelyne Balint, MD  Sex: Male Height: 5\' 9"  Ref Phys: Sheran Luz, MD  ID#: [id number]   Technician:    History: This is a 67 year old male with tingling in the right upper limb.  NCV & EMG Findings: Extensive electrodiagnostic evaluation of the right upper limb shows: Right median, ulnar, and radial sensory responses are within normal limits. Right median (APB) motor response is within normal limits. Right ulnar (ADM) motor response shows normal amplitude but slowing of conduction velocity from above the elbow to below the elbow stimulation sites (40 m/s). There is no definitive evidence of active or chronic motor axon loss changes affecting any of the tested muscles on needle examination. Motor unit configuration and recruitment pattern is within normal limits.  Neuromuscular ultrasound findings: High frequency (4.0-16.0 MHz) B-mode, nonvascular ultrasound of the right upper limb shows: Cross sectional areas (CSA) of the right ulnar (below elbow to mid upper arm) nerve are within normal limits. There is no subluxation or dislocation of the right ulnar nerve from the ulnar groove with maximal elbow flexion. No other obvious lesion involving the adjacent bone or tendon is identified. No definite vascular abnormalities.  Impression: This is a complex study of the right upper limb. 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 However, there is no conduction block across the elbow, ulnar sensory study is normal, needle examination of ulnar innervated muscles are normal, neuromuscular ultrasound does not show clear focal enlargement of the right ulnar nerve, and the nerve does  not sublux or dislocate from the ulnar groove with maximal elbow flexion. No electrodiagnostic evidence of a right cervical (C5-C8) motor radiculopathy. No electrodiagnostic evidence of a right median mononeuropathy at or distal to the wrist (ie: carpal tunnel syndrome). Screening studies for right radial mononeuropathy are normal.      ________________ Jacquelyne Balint, MD    NCS+ Motor Nerve Results    Latency Amplitude F-Lat Segment Distance CV Comment  Site (ms) Norm (mV) Norm (ms)  (cm) (m/s) Norm   Right Median (APB) Motor  Wrist 3.0  < 4.0 6.7  > 5.0        Elbow 8.9 - 6.0 -  Elbow-Wrist 31.5 53  > 50   Right Ulnar (ADM) Motor  Wrist 2.2  < 3.1 9.7  > 7.0 32.1       Bel elbow 6.7 - 8.7 -  Bel elbow-Wrist 24 53  > 50   Ab elbow 9.2 - 8.1 -  Ab elbow-Bel elbow 10 40 -    Sensory Sites    Neg Peak Lat Amplitude (O-P) Segment Distance Velocity Comment  Site (ms) Norm (V) Norm  (cm) (ms)   Right Median Sensory  Wrist-Dig II 3.3  < 3.8 23  > 10 Wrist-Dig II 13    Right Radial Sensory  Forearm-Wrist 2.4  < 2.8 18  > 10 Forearm-Wrist 10    Right Ulnar Sensory  Wrist-Dig V 3.0  < 3.2 30  > 5 Wrist-Dig V 11     EMG+   Side Muscle Ins.Act Fibs Fasc Recrt Amp Dur Poly Activation Comment  Right FDI Nml Nml Nml Nml Nml Nml Nml Nml N/A  Right ADM Nml Nml  Nml Nml Nml Nml Nml Nml N/A  Right EIP Nml Nml Nml Nml Nml Nml Nml Nml N/A  Right FPL Nml Nml Nml Nml Nml Nml Nml Nml N/A  Right Pronator teres Nml Nml Nml Nml Nml Nml Nml Nml N/A  Right FCU Nml Nml Nml Nml Nml Nml Nml Nml N/A  Right Biceps Nml Nml Nml Nml Nml Nml Nml Nml N/A  Right Triceps Nml Nml Nml Nml Nml Nml Nml Nml N/A  Right Deltoid Nml Nml Nml Nml Nml Nml Nml Nml N/A  Right C7 PSP Nml Nml Nml Nml Nml Nml Nml Nml N/A   Nerve Measurements   Site Area Mobility Vascularity Comment   mm Norm     Right Ulnar  Distal elbow 5.9  < 10.0      Elbow 7.0  < 10.0      Proximal elbow 6.4  < 10.0    Prominent fascicles   Mid-arm 4.6  < 10.0          Waveforms:  Motor      Sensory        F-Wave

## 2023-08-04 ENCOUNTER — Other Ambulatory Visit (HOSPITAL_COMMUNITY): Payer: Self-pay

## 2023-08-07 ENCOUNTER — Encounter: Payer: Self-pay | Admitting: Neurology

## 2023-08-07 ENCOUNTER — Telehealth: Payer: Self-pay

## 2023-08-10 ENCOUNTER — Telehealth: Payer: Self-pay

## 2023-08-10 NOTE — Telephone Encounter (Signed)
Pt reported that He had seen the results.

## 2023-08-11 ENCOUNTER — Other Ambulatory Visit (HOSPITAL_COMMUNITY): Payer: Self-pay

## 2023-08-11 ENCOUNTER — Other Ambulatory Visit: Payer: Self-pay

## 2023-08-12 ENCOUNTER — Other Ambulatory Visit: Payer: Self-pay

## 2023-08-14 ENCOUNTER — Other Ambulatory Visit (HOSPITAL_COMMUNITY): Payer: Self-pay

## 2023-08-14 ENCOUNTER — Other Ambulatory Visit: Payer: Self-pay

## 2023-08-14 MED ORDER — FLUTICASONE-SALMETEROL 100-50 MCG/ACT IN AEPB
1.0000 | INHALATION_SPRAY | Freq: Every day | RESPIRATORY_TRACT | 11 refills | Status: DC
  Filled 2023-08-14: qty 60, 60d supply, fill #0

## 2023-08-20 ENCOUNTER — Other Ambulatory Visit (HOSPITAL_COMMUNITY)
Admission: RE | Admit: 2023-08-20 | Discharge: 2023-08-20 | Disposition: A | Discharge status: Home / Self Care | Payer: 59 | Source: Ambulatory Visit | Attending: Oncology | Admitting: Oncology

## 2023-08-20 DIAGNOSIS — Z006 Encounter for examination for normal comparison and control in clinical research program: Secondary | ICD-10-CM | POA: Insufficient documentation

## 2023-09-01 LAB — GENECONNECT MOLECULAR SCREEN: Genetic Analysis Overall Interpretation: NEGATIVE

## 2023-09-08 ENCOUNTER — Other Ambulatory Visit (HOSPITAL_COMMUNITY): Payer: Self-pay

## 2023-09-10 ENCOUNTER — Other Ambulatory Visit (HOSPITAL_COMMUNITY): Payer: Self-pay

## 2023-10-13 ENCOUNTER — Other Ambulatory Visit: Payer: Self-pay

## 2023-10-13 ENCOUNTER — Other Ambulatory Visit (HOSPITAL_COMMUNITY): Payer: Self-pay

## 2023-11-09 ENCOUNTER — Other Ambulatory Visit: Payer: Self-pay

## 2023-11-09 ENCOUNTER — Other Ambulatory Visit (HOSPITAL_COMMUNITY): Payer: Self-pay

## 2023-11-16 DIAGNOSIS — M25552 Pain in left hip: Secondary | ICD-10-CM | POA: Diagnosis not present

## 2023-11-24 ENCOUNTER — Other Ambulatory Visit: Payer: Self-pay

## 2023-11-24 MED ORDER — SUCRALFATE 1 GM/10ML PO SUSP: 1.0000 g | Freq: Four times a day (QID) | ORAL | 1 refills | Status: DC

## 2023-12-10 ENCOUNTER — Other Ambulatory Visit: Payer: Self-pay

## 2023-12-10 ENCOUNTER — Encounter: Payer: Self-pay | Admitting: Internal Medicine

## 2023-12-10 MED ORDER — MESALAMINE 1.2 G PO TBEC: 2.4000 g | DELAYED_RELEASE_TABLET | Freq: Two times a day (BID) | ORAL | 0 refills | Status: DC

## 2023-12-11 ENCOUNTER — Other Ambulatory Visit: Payer: Self-pay | Admitting: Internal Medicine

## 2023-12-11 ENCOUNTER — Encounter: Payer: Self-pay | Admitting: Internal Medicine

## 2023-12-11 NOTE — Telephone Encounter (Signed)
Inbound call from patient, is requesting medication be called in due to Ulcerative colitis flare. Patient states he is unsure if he would need to come in for that.

## 2023-12-14 ENCOUNTER — Other Ambulatory Visit: Payer: Self-pay

## 2023-12-14 MED ORDER — HYOSCYAMINE SULFATE 0.125 MG SL SUBL: SUBLINGUAL_TABLET | SUBLINGUAL | 2 refills | Status: DC

## 2023-12-14 NOTE — Telephone Encounter (Signed)
He should resume Lialda up to 4.8 g/day Okay to refill hyoscyamine 0.125 mg sublingual, 1-2 tabs every 6-8 hours as needed

## 2023-12-24 DIAGNOSIS — H524 Presbyopia: Secondary | ICD-10-CM | POA: Diagnosis not present

## 2023-12-28 DIAGNOSIS — R972 Elevated prostate specific antigen [PSA]: Secondary | ICD-10-CM | POA: Diagnosis not present

## 2024-01-26 DIAGNOSIS — K080 Exfoliation of teeth due to systemic causes: Secondary | ICD-10-CM | POA: Diagnosis not present

## 2024-01-26 DIAGNOSIS — R972 Elevated prostate specific antigen [PSA]: Secondary | ICD-10-CM | POA: Diagnosis not present

## 2024-02-04 DIAGNOSIS — K080 Exfoliation of teeth due to systemic causes: Secondary | ICD-10-CM | POA: Diagnosis not present

## 2024-02-15 DIAGNOSIS — K080 Exfoliation of teeth due to systemic causes: Secondary | ICD-10-CM | POA: Diagnosis not present

## 2024-02-25 DIAGNOSIS — R972 Elevated prostate specific antigen [PSA]: Secondary | ICD-10-CM | POA: Diagnosis not present

## 2024-02-26 DIAGNOSIS — M25552 Pain in left hip: Secondary | ICD-10-CM | POA: Diagnosis not present

## 2024-03-08 DIAGNOSIS — N429 Disorder of prostate, unspecified: Secondary | ICD-10-CM | POA: Diagnosis not present

## 2024-03-08 DIAGNOSIS — M160 Bilateral primary osteoarthritis of hip: Secondary | ICD-10-CM | POA: Diagnosis not present

## 2024-03-08 DIAGNOSIS — N400 Enlarged prostate without lower urinary tract symptoms: Secondary | ICD-10-CM | POA: Diagnosis not present

## 2024-03-08 DIAGNOSIS — R972 Elevated prostate specific antigen [PSA]: Secondary | ICD-10-CM | POA: Diagnosis not present

## 2024-03-10 ENCOUNTER — Other Ambulatory Visit: Payer: Self-pay

## 2024-03-10 ENCOUNTER — Telehealth: Payer: Self-pay | Admitting: Internal Medicine

## 2024-03-10 MED ORDER — ESOMEPRAZOLE MAGNESIUM 40 MG PO CPDR: 40.0000 mg | DELAYED_RELEASE_CAPSULE | Freq: Every day | ORAL | 3 refills | Status: DC

## 2024-03-10 NOTE — Telephone Encounter (Signed)
 Inbound call from patient stating he has epigastric pain that is progressively getting worse. Patient also requesting to have Dexilant medication switched to Nexium due to Dexilant not being covered by insurance. Patient is requesting a call back to discuss further. Please advise, thank you.

## 2024-03-10 NOTE — Telephone Encounter (Signed)
 Pt states he needs the nexium 40mg  sent to the pharmacy for daily dosing as the dexilant is not on formulary. Pt states his reflux has been bothering him and requests to be seen. Pt scheduled to see Quentin Mulling PA 03/21/24 @1 :30pm.

## 2024-03-11 ENCOUNTER — Other Ambulatory Visit: Payer: Self-pay

## 2024-03-11 MED ORDER — ESOMEPRAZOLE MAGNESIUM 40 MG PO CPDR: 40.0000 mg | DELAYED_RELEASE_CAPSULE | Freq: Every day | ORAL | 3 refills | Status: DC

## 2024-03-18 NOTE — Progress Notes (Signed)
 03/21/2024 [name] [id number] 1956/07/23  Referring provider: Chilton Greathouse, MD Primary GI doctor: [name]  ASSESSMENT AND PLAN:   GERD with epigastric discomfort EGD December 2023 mild striped gastritis biopsied showed chronic inflammation but no H. pylori or abnormal cells.  Previously been on Dexilant, pantoprazole Pepcid as needed and Mylanta Started on Dexilant and Carafate 02/23/2023 however Dexilant was not covered by insurance so 03/10/2024 switched to Nexium 40 mg but still on dexilant until it is done.  Last 3-4 months has had raw aching pain, improves with eating, pepcid or pepto.  On celebrex 200 mg but cut back to 100 mg and now more sporadic.  No radiation, no accompaniments, no nausea, vomiting, early satiety, gas, bloating, change in bowel habits.  Possible gastritis, GB, gastroparesis post infection Has some RUQ pain to palpation and pain at periumblical will get AB [place], consider CT AB Do trial of FDgard Discussed possible EGD or not, patient is uncertain if he wants this, would like Dr. [name] suggestion.  Consider GES versus SIBO testing  History of ulcerative proctosigmoiditis diagnosed 1993 Colonoscopy December 2023 mild inflammation in rectum to sigmoid chronic active inflammation of the sigmoid colonic mucosa benign descending colon to cecum.   Maintained on Lialda 4.8 g daily 02/24/2023 fecal calprotectin 92 11/13/2022 CRP 28, sed rate 33 -  will recheck CRP, sed rate and fecal calprotectin - Will check TB Gold and hepatitis B case we need to advance therapy - Continue Lialda 4.8 grams daily - Consider repeat cross-sectional imaging  Elevated PSA Be evaluated with [facility] has biopsy/excision prostate with MRI fusion scheduled 08/03/2024 with Dr. 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   Medically managed with amlodipine and high-dose statin.  Patient Care Team: Chilton Greathouse, MD as PCP - General (Internal Medicine) Yates Decamp, MD as Consulting Physician (Cardiology)  HISTORY OF PRESENT ILLNESS: 68 y.o. male with a past medical history listed below presents for evaluation of reflux.   Patient has history of ulcerative proctosigmoiditis diagnosed 1993 last office visit 02/23/2023 with [name]. CT abdomen pelvis 10/20/2022 circumferential wall thickening descending sigmoid, prostamegaly minimal mass effect Colonoscopy and endoscopy December 2023 showed mild inflammation in rectum to sigmoid chronic active inflammation of the sigmoid colonic mucosa benign descending colon to cecum.  Upper endoscopy revealed mild striped gastritis biopsies chronic inflammation but no H. pylori or abnormal cells. Has been stable on Lialda 4.8 g daily some mild symptoms when patient is on 3 pills daily or is inconsistent while traveling.  Patient was on Dexilant and Carafate for GERD symptoms however Dexilant was not covered so he was switched to Nexium 03/10/24.  Discussed the use of AI scribe software for clinical note transcription with the patient, who gave verbal consent to proceed.  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  He has a history of ulcerative colitis, with a colonoscopy in December 2023 showing mild inflammation at the rectum sigmoid and chronic active inflammation. He is currently on Lialda 4.8 grams daily, and missing doses can lead to minor symptoms such as left-sided abdominal pain and changes in bowel habits, though not severe diarrhea. His last fecal calprotectin in March 2024 was normal, indicating controlled inflammation. Bowel habits  are mostly well-controlled, though he sometimes needs to go twice a day with normal-looking stools.  He experiences some tenderness in the lower abdomen but no significant tenderness elsewhere.  He describes a separate issue of gastritis that began in December 2023 after a trip to [place], where he experienced epigastric pain and significant weight loss, losing 10 pounds. He was treated with Dexilant, which improved his symptoms but did not return him to normal. Since then, he has avoided spicy foods. Over the last three to six months, he experiences a 'raw' gnawing pain shortly after eating, which is relieved by food or antacids like Pepcid or Pepto Bismol. This occurs randomly, up to two or three times a day, and is not consistently alleviated by PPIs. He has been dependent on antacids almost daily. An endoscopy at the time of his gastritis episode showed mild striped gastritis with chronic inflammation but no H. pylori or abnormal cells.  He has a history of taking Celebrex 200 mg daily for several years until two years ago, now reduced to 100 mg sporadically. He is currently transitioning from Dexilant to Nexium due to insurance issues and has tried Carafate without benefit.  He reports a slight weight loss recently, down to 140 pounds from a previous 148-149 pounds, and attributes this partly to reduced exercise. No alcohol use and no new medications or supplements recently. No family history of gallbladder issues. No radiation of the pain, nausea, vomiting, early satiety, bloating, black stools, or significant belching.      He  reports that he has never smoked. He has never used smokeless tobacco. He reports that he does not drink alcohol and does not use drugs.  RELEVANT GI HISTORY, IMAGING AND LABS: Results   LABS Fecal calprotectin: Normal (02/2023)  RADIOLOGY CT abdomen pelvis: Normal liver, small lesion likely cyst, normal gallbladder, normal ducts, no stones, wall thickening in descending sigmoid colon (2023)  DIAGNOSTIC Colonoscopy: Mild inflammation at the rectum sigmoid, chronic  active inflammation (11/2022) Endoscopy: Mild striped gastritis, chronic inflammation, no H. pylori, no abnormal cells (11/2022)      CBC    Component Value Date/Time   WBC 5.2 11/13/2022 1703   RBC 4.77 11/13/2022 1703   HGB 14.6 11/13/2022 1703   HCT 43.0 11/13/2022 1703   PLT 244.0 11/13/2022 1703   MCV 90.0 11/13/2022 1703   MCH 31.0 10/22/2022 0345   MCHC 34.0 11/13/2022 1703   RDW 14.0 11/13/2022 1703   LYMPHSABS 1.9 11/13/2022 1703   MONOABS 0.8 11/13/2022 1703   EOSABS 0.1 11/13/2022 1703   BASOSABS 0.0 11/13/2022 1703   No results for input(s): "HGB" in the last 8760 hours.  CMP     Component Value Date/Time   NA 135 11/13/2022 1703   K 4.1 11/13/2022 1703   CL 101 11/13/2022 1703   CO2 28 11/13/2022 1703   GLUCOSE 80 11/13/2022 1703   BUN 11 11/13/2022 1703   CREATININE 0.80 11/13/2022 1703   CALCIUM 9.2 11/13/2022 1703   PROT 7.4 11/13/2022 1703   ALBUMIN 4.1 11/13/2022 1703   AST 19 11/13/2022 1703   ALT 19 11/13/2022 1703   ALKPHOS 63 11/13/2022 1703   BILITOT 0.9 11/13/2022 1703   GFRNONAA >60 10/22/2022 0345   GFRAA >90 01/25/2012 0554      Latest Ref Rng & Units 11/13/2022    5:03 PM 01/25/2012    6:43 AM 12/20/2007    5:20 PM  Hepatic Function  Total Protein 6.0 - 8.3 g/dL 7.4  7.6  7.6   Albumin 3.5 - 5.2 g/dL 4.1  4.0  4.1   AST 0 - 37 U/L 19  19  20    ALT 0 - 53 U/L 19  21  21    Alk Phosphatase 39 - 117 U/L 63  66  51   Total Bilirubin 0.2 - 1.2 mg/dL 0.9  1.2  2.1   Bilirubin, Direct 0.0 - 0.3 mg/dL  0.2        Current Medications:   Current Outpatient Medications (Endocrine & Metabolic):    desmopressin (DDAVP) 0.2 MG tablet, Take 1 tablet (0.2 mg total) by mouth 2 (two) times daily as directed  Current Outpatient Medications (Cardiovascular):    metoprolol succinate (TOPROL XL) 25 MG 24 hr tablet, Take 1.5 tablets (37.5 mg total) by mouth daily.   rosuvastatin (CRESTOR) 20 MG tablet, Take 1 tablet (20 mg total) by mouth  daily.  Current Outpatient Medications (Respiratory):    albuterol (VENTOLIN HFA) 108 (90 Base) MCG/ACT inhaler, Inhale 2 puffs into the lungs every 4 (four) hours as needed.   azelastine (ASTELIN) 0.1 % nasal spray, Place 1 spray into both nostrils 2 (two) times daily.   Azelastine HCl 137 MCG/SPRAY SOLN, Place 1 spray into each nostril 2 (two) times daily.   fluticasone-salmeterol (ADVAIR) 100-50 MCG/ACT AEPB, Inhale 1 puff into the lungs daily.   ipratropium (ATROVENT) 0.06 % nasal spray, Place 2 sprays into both nostrils 3 (three) times daily.  Current Outpatient Medications (Analgesics):    acetaminophen (TYLENOL) 500 MG tablet, Take 500-1,000 mg by mouth as needed.   celecoxib (CELEBREX) 100 MG capsule, Take 1 capsule (100 mg total) by mouth 2 (two) times daily. (Patient taking differently: Take 100 mg by mouth as needed.)   Current Outpatient Medications (Other):    Cholecalciferol (VITAMIN D3) 125 MCG (5000 UT) TABS, Take 5,000 tablets by mouth. Twice weekly.   COMIRNATY syringe,    diclofenac Sodium (VOLTAREN) 1 % GEL, Apply 2 g topically to the affected area(s) 4 (four) times daily.   esomeprazole (NEXIUM) 40 MG capsule, Take 1 capsule (40 mg total) by mouth daily at 12 noon.   hyoscyamine (LEVSIN SL) 0.125 MG SL tablet, Take 1 by mouth every 6-8 hours as needed   mesalamine (LIALDA) 1.2 g EC tablet, Take 2 tablets (2.4 g total) by mouth in the morning and at bedtime.   methocarbamol (ROBAXIN) 500 MG tablet, Take 1 tablet (500 mg total) by mouth every 8 (eight) hours. (Patient taking differently: Take 500 mg by mouth every 8 (eight) hours as needed.)   nystatin cream (MYCOSTATIN), Apply 2-3 times to affected areas as directed External 30 days   pregabalin (LYRICA) 25 MG capsule, Take 1 capsule (25 mg total) by mouth 2 (two) times daily. (Patient taking differently: Take 25 mg by mouth 2 (two) times daily as needed.)   Zolpidem Tartrate 1.75 MG SUBL, Place 1 tablet under the tongue  once a night.   sucralfate (CARAFATE) 1 GM/10ML suspension, Take 10 mLs (1 g total) by mouth 4 (four) times daily. (Patient not taking: Reported on 03/21/2024)  Medical History:  Past Medical History:  Diagnosis Date   Arthritis 03/2020   R hip   Asthma    Elevated PSA    History of adenomatous polyp of colon    Hypercholesteremia    Internal hemorrhoids    Monoallelic mutation of AVP gene    Ulcerative colitis    Allergies:  Allergies  Allergen Reactions  Amoxicillin-Pot Clavulanate Diarrhea and Nausea Only    Other Reaction(s): GI Intolerance   Cephalexin     Other Reaction(s): other   Doxycycline Diarrhea and Nausea Only    Other Reaction(s): GI Intolerance  doxycycline   Levofloxacin     Other Reaction(s): other   Metronidazole     Other Reaction(s): other   Prednisone     Other Reaction(s): other  Gastritis.   prednisone     Surgical History:  He  has a past surgical history that includes LEFT HEART CATH AND CORONARY ANGIOGRAPHY (N/A, 05/06/2018); ATRIAL FIBRILLATION ABLATION; and Colonoscopy. Family History:  His family history includes Heart disease in his father.  REVIEW OF SYSTEMS  : All other systems reviewed and negative except where noted in the History of Present Illness.  PHYSICAL EXAM: BP 130/70 (BP Location: Left Arm, Patient Position: Sitting, Cuff Size: Normal)   Pulse 72   Ht 5\' 8"  (1.727 m)   Wt 142 lb 2 oz (64.5 kg)   BMI 21.61 kg/m  Physical Exam   MEASUREMENTS: Weight- 140. GENERAL APPEARANCE: Well nourished, in no apparent distress. HEENT: No cervical lymphadenopathy, unremarkable thyroid, sclerae anicteric, conjunctiva pink. RESPIRATORY: Respiratory effort normal, breath sounds equal bilaterally without rales, rhonchi, or wheezing. CARDIO: Regular rate and rhythm with no murmurs, rubs, or gallops, peripheral pulses intact. ABDOMEN: Soft, non-distended, active bowel sounds in all four quadrants, mild tenderness in the right upper  quadrant, no rebound tenderness, no mass appreciated. RECTAL: Declines. MUSCULOSKELETAL: Full range of motion, normal gait, without edema. SKIN: Dry, intact without rashes or lesions. No jaundice. NEURO: Alert, oriented, no focal deficits. PSYCH: Cooperative, normal mood and affect.      Doree Albee, PA-C 3:44 PM

## 2024-03-21 ENCOUNTER — Encounter: Payer: Self-pay | Admitting: Physician Assistant

## 2024-03-21 ENCOUNTER — Other Ambulatory Visit (INDEPENDENT_AMBULATORY_CARE_PROVIDER_SITE_OTHER)

## 2024-03-21 ENCOUNTER — Ambulatory Visit: Admitting: Physician Assistant

## 2024-03-21 VITALS — BP 130/70 | HR 72 | Ht 68.0 in | Wt 142.1 lb

## 2024-03-21 DIAGNOSIS — K5150 Left sided colitis without complications: Secondary | ICD-10-CM | POA: Diagnosis not present

## 2024-03-21 DIAGNOSIS — K51319 Ulcerative (chronic) rectosigmoiditis with unspecified complications: Secondary | ICD-10-CM

## 2024-03-21 DIAGNOSIS — Z8719 Personal history of other diseases of the digestive system: Secondary | ICD-10-CM | POA: Diagnosis not present

## 2024-03-21 DIAGNOSIS — R1013 Epigastric pain: Secondary | ICD-10-CM | POA: Diagnosis not present

## 2024-03-21 DIAGNOSIS — R1011 Right upper quadrant pain: Secondary | ICD-10-CM

## 2024-03-21 DIAGNOSIS — R972 Elevated prostate specific antigen [PSA]: Secondary | ICD-10-CM

## 2024-03-21 DIAGNOSIS — K219 Gastro-esophageal reflux disease without esophagitis: Secondary | ICD-10-CM

## 2024-03-21 LAB — CBC WITH DIFFERENTIAL/PLATELET
Basophils Absolute: 0 10*3/uL (ref 0.0–0.1)
Basophils Relative: 0.5 % (ref 0.0–3.0)
Eosinophils Absolute: 0.1 10*3/uL (ref 0.0–0.7)
Eosinophils Relative: 1.9 % (ref 0.0–5.0)
HCT: 43.3 % (ref 39.0–52.0)
Hemoglobin: 14.2 g/dL (ref 13.0–17.0)
Lymphocytes Relative: 27 % (ref 12.0–46.0)
Lymphs Abs: 1.8 10*3/uL (ref 0.7–4.0)
MCHC: 32.9 g/dL (ref 30.0–36.0)
MCV: 92.2 fl (ref 78.0–100.0)
Monocytes Absolute: 0.5 10*3/uL (ref 0.1–1.0)
Monocytes Relative: 7.4 % (ref 3.0–12.0)
Neutro Abs: 4.2 10*3/uL (ref 1.4–7.7)
Neutrophils Relative %: 63.2 % (ref 43.0–77.0)
Platelets: 207 10*3/uL (ref 150.0–400.0)
RBC: 4.7 Mil/uL (ref 4.22–5.81)
RDW: 13.8 % (ref 11.5–15.5)
WBC: 6.6 10*3/uL (ref 4.0–10.5)

## 2024-03-21 LAB — HIGH SENSITIVITY CRP: CRP, High Sensitivity: 2.91 mg/L (ref 0.000–5.000)

## 2024-03-21 LAB — COMPREHENSIVE METABOLIC PANEL WITH GFR
ALT: 12 U/L (ref 0–53)
AST: 13 U/L (ref 0–37)
Albumin: 3.9 g/dL (ref 3.5–5.2)
Alkaline Phosphatase: 74 U/L (ref 39–117)
BUN: 18 mg/dL (ref 6–23)
CO2: 31 meq/L (ref 19–32)
Calcium: 9.5 mg/dL (ref 8.4–10.5)
Chloride: 102 meq/L (ref 96–112)
Creatinine, Ser: 0.75 mg/dL (ref 0.40–1.50)
GFR: 91.92 mL/min (ref >60.00)
Glucose, Bld: 87 mg/dL (ref 70–99)
Potassium: 4 meq/L (ref 3.5–5.1)
Sodium: 139 meq/L (ref 135–145)
Total Bilirubin: 1.1 mg/dL (ref 0.2–1.2)
Total Protein: 7.1 g/dL (ref 6.0–8.3)

## 2024-03-21 LAB — SEDIMENTATION RATE: Sed Rate: 17 mm/h (ref 0–20)

## 2024-03-21 NOTE — Patient Instructions (Addendum)
 Your provider has requested that you go to the basement level for lab work before leaving today. Press "B" on the elevator. The lab is located at the first door on the left as you exit the elevator.  You have been scheduled for an abdominal ultrasound at [facility] (1st floor of hospital) on 03/28/24 at 9:00am. Please arrive 30 minutes prior to your appointment for registration. Make certain not to have anything to eat or drink after midnight prior to your appointment. Should you need to reschedule your appointment, please contact radiology at [phone]. This test typically takes about 30 minutes to perform.   Please take your proton pump inhibitor medication, dexilant or  nexium in the morning, add on pepcid at night for at least 2 weeks Please take this medication 30 minutes to 1 hour before meals- this makes it more effective.  Avoid spicy and acidic foods Avoid fatty foods Limit your intake of coffee, tea, alcohol, and carbonated drinks Work to maintain a healthy weight Keep the head of the bed elevated at least 3 inches with blocks or a wedge pillow if you are having any nighttime symptoms Stay upright for 2 hours after eating Avoid meals and snacks three to four hours before bedtime  Do trial of Fdgard samples given  _______________________________________________________  If your blood pressure at your visit was 140/90 or greater, please contact your primary care physician to follow up on this.  _______________________________________________________  If you are age 70 or older, your body mass index should be between 23-30. Your Body mass index is 21.61 kg/m. If this is out of the aforementioned range listed, please consider follow up with your Primary Care Provider.  If you are age 36 or younger, your body mass index should be between 19-25. Your Body mass index is 21.61 kg/m. If this is out of the aformentioned range listed, please consider follow up with your Primary  Care Provider.   ________________________________________________________  The [facility] providers would like to encourage you to use [facility] to communicate with providers for non-urgent requests or questions.  Due to long hold times on the telephone, sending your provider a message by [facility] may be a faster and more efficient way to get a response.  Please allow 48 business hours for a response.  Please remember that this is for non-urgent requests.  _______________________________________________________

## 2024-03-23 LAB — QUANTIFERON-TB GOLD PLUS
Mitogen-NIL: 8.21 [IU]/mL
NIL: 0.15 [IU]/mL
QuantiFERON-TB Gold Plus: NEGATIVE
TB1-NIL: <0 [IU]/mL
TB2-NIL: 0.02 [IU]/mL

## 2024-03-25 ENCOUNTER — Other Ambulatory Visit

## 2024-03-25 ENCOUNTER — Other Ambulatory Visit: Payer: Self-pay

## 2024-03-25 DIAGNOSIS — R1011 Right upper quadrant pain: Secondary | ICD-10-CM

## 2024-03-25 DIAGNOSIS — R1013 Epigastric pain: Secondary | ICD-10-CM | POA: Diagnosis not present

## 2024-03-25 DIAGNOSIS — K5150 Left sided colitis without complications: Secondary | ICD-10-CM | POA: Diagnosis not present

## 2024-03-28 ENCOUNTER — Ambulatory Visit (HOSPITAL_COMMUNITY)
Admission: RE | Admit: 2024-03-28 | Discharge: 2024-03-28 | Disposition: A | Discharge status: Home / Self Care | Source: Ambulatory Visit | Attending: Physician Assistant | Admitting: Physician Assistant

## 2024-03-28 DIAGNOSIS — K5150 Left sided colitis without complications: Secondary | ICD-10-CM | POA: Diagnosis not present

## 2024-03-28 DIAGNOSIS — K769 Liver disease, unspecified: Secondary | ICD-10-CM | POA: Diagnosis not present

## 2024-03-28 DIAGNOSIS — R1013 Epigastric pain: Secondary | ICD-10-CM | POA: Diagnosis not present

## 2024-03-28 DIAGNOSIS — R1011 Right upper quadrant pain: Secondary | ICD-10-CM | POA: Diagnosis not present

## 2024-03-28 DIAGNOSIS — R932 Abnormal findings on diagnostic imaging of liver and biliary tract: Secondary | ICD-10-CM | POA: Diagnosis not present

## 2024-03-30 ENCOUNTER — Other Ambulatory Visit: Payer: Self-pay

## 2024-03-30 MED ORDER — MESALAMINE 1.2 G PO TBEC: 2.4000 g | DELAYED_RELEASE_TABLET | Freq: Two times a day (BID) | ORAL | 1 refills | Status: AC

## 2024-03-31 ENCOUNTER — Institutional Professional Consult (permissible substitution): Admitting: Neurology

## 2024-04-01 LAB — CALPROTECTIN: Calprotectin: 82 ug/g

## 2024-04-04 NOTE — Telephone Encounter (Signed)
 Can we add pt in at 1130 AM tomorrow? Thanks [employer]

## 2024-04-05 ENCOUNTER — Telehealth: Payer: Self-pay

## 2024-04-05 ENCOUNTER — Ambulatory Visit: Admitting: Internal Medicine

## 2024-04-05 ENCOUNTER — Encounter: Payer: Self-pay | Admitting: Internal Medicine

## 2024-04-05 ENCOUNTER — Other Ambulatory Visit (HOSPITAL_COMMUNITY): Payer: Self-pay

## 2024-04-05 VITALS — BP 120/70 | HR 70 | Ht 69.0 in | Wt 141.6 lb

## 2024-04-05 DIAGNOSIS — K51319 Ulcerative (chronic) rectosigmoiditis with unspecified complications: Secondary | ICD-10-CM

## 2024-04-05 DIAGNOSIS — R1013 Epigastric pain: Secondary | ICD-10-CM | POA: Diagnosis not present

## 2024-04-05 MED ORDER — BUDESONIDE ER 9 MG PO TB24
9.0000 mg | ORAL_TABLET | Freq: Every day | ORAL | 1 refills | Status: DC
Start: 2024-04-05 — End: 2024-07-12

## 2024-04-05 MED ORDER — BUDESONIDE ER 9 MG PO TB24: 9.0000 mg | ORAL_TABLET | Freq: Every day | ORAL | 1 refills | Status: DC

## 2024-04-05 NOTE — Telephone Encounter (Signed)
 Pharmacy Patient Advocate Encounter   Received notification from CoverMyMeds that prior authorization for Budesonide  ER 9MG  er tablets is required/requested.   Insurance verification completed.   The patient is insured through [employer].   Per test claim: PA required; PA submitted to above mentioned insurance via CoverMyMeds Key/confirmation #/[id number] Status is pending

## 2024-04-05 NOTE — Telephone Encounter (Signed)
 Patients insurance called stating patient has been approved for medication 1 year from today. [employer] will be faxing approval information over.

## 2024-04-05 NOTE — Progress Notes (Signed)
 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  He is here alone today.  He was last seen for colonoscopy and upper endoscopy in December 2023 and then by Barbar Bonus, PA-C 1 month ago.  He is here alone today   He reports that he has had recurrent symptoms similar to past colitis flares with epigastric and left-sided abdominal pain.  He stopped Celebrex  2 weeks ago and feels about 20% better.  He is continue Nexium  40 mg in the morning and Pepcid 20 mg in the p.m.  About a month ago he increase Lialda  from 2.4 g to 4.8 g daily.  He has bowel movements are regular with rare mucus.  No blood or melena.  Left-sided abdominal pain is better with increase in Lialda .  Still with intermittent epigastric pain and 4 pound weight loss.  This is despite a good appetite and oral intake.  No nausea or vomiting.  He has questions about possible probiotic but is not currently taking 1.  He is eating Activia yogurt.   Review of Systems As per HPI, otherwise negative  Current Medications, Allergies, Past Medical History, Past Surgical History, Family History and Social History were reviewed in [employer] record.    Objective:   Physical Exam BP 120/70   Pulse 70   Ht 5\' 9"  (1.753 m)   Wt 141 lb 9.6 oz (64.2 kg)   BMI 20.91 kg/m  Gen: awake, alert, NAD HEENT: anicteric  Neuro: nonfocal     Latest Ref Rng & Units 03/21/2024    2:37 PM 11/13/2022    5:03 PM 10/22/2022    3:45 AM  CBC  WBC 4.0 - 10.5 K/uL 6.6  5.2  9.0   Hemoglobin 13.0 - 17.0 g/dL 40.9  81.1  91.4   Hematocrit 39.0 - 52.0 % 43.3  43.0  45.6   Platelets 150.0 - 400.0 K/uL 207.0  244.0  267    CMP     Component Value Date/Time   NA 139 03/21/2024 1437   K 4.0 03/21/2024  1437   CL 102 03/21/2024 1437   CO2 31 03/21/2024 1437   GLUCOSE 87 03/21/2024 1437   BUN 18 03/21/2024 1437   CREATININE 0.75 03/21/2024 1437   CALCIUM  9.5 03/21/2024 1437   PROT 7.1 03/21/2024 1437   ALBUMIN 3.9 03/21/2024 1437   AST 13 03/21/2024 1437   ALT 12 03/21/2024 1437   ALKPHOS 74 03/21/2024 1437   BILITOT 1.1 03/21/2024 1437   GFR 91.92 03/21/2024 1437   GFRNONAA >60 10/22/2022 0345   hsCRP 2.910 ESR 17 (prev 33)  Fecal calprotectin on 03/25/2024 borderline elevated at 82 Fecal calprotectin on 06/08/2023 normal at 13      Assessment & Plan:  68 year old male with a history of ulcerative proctosigmoiditis diagnosed in 1993, 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  Left-sided colitis with epigastric and left-sided abdominal pain --symptoms are most consistent with a flare of his proctosigmoiditis.  His epigastric pain may be partially NSAID related but I think is more of a colitis manifestation than a gastritis manifestation.  This is supported by previous endoscopic evaluation, response to colitis therapy and borderline elevated fecal calprotectin at this time -- Add Uceris   9 mg daily x 8 weeks -- Continue the higher dose of Lialda  at 4.8 g daily -- Remain off Celebrex  and other NSAIDs for now -- Could consider VSL 3 in the future at 1 double strength packet daily; but otherwise avoid probiotics for now -- Fermented foods and high-fiber diet recommended  2.  History of gastritis --continue Nexium  40 mg in the morning and famotidine 20 mg in the evening  30 minutes total spent today including patient facing time, coordination of care, reviewing medical history/procedures/pertinent radiology studies, and documentation of the encounter.

## 2024-04-05 NOTE — Patient Instructions (Addendum)
 We have sent the following medications to your pharmacy for you to pick up at your convenience: Uceris  9 mg daily x 8 weeks.  Continue Lialda , Nexium  and famotidine and current doses.  Remain off of Celebrex .   Avoid over the counter probiotics.  _______________________________________________________  If your blood pressure at your visit was 140/90 or greater, please contact your primary care physician to follow up on this.  _______________________________________________________  If you are age 30 or older, your body mass index should be between 23-30. Your Body mass index is 20.91 kg/m. If this is out of the aforementioned range listed, please consider follow up with your Primary Care Provider.  If you are age 8 or younger, your body mass index should be between 19-25. Your Body mass index is 20.91 kg/m. If this is out of the aformentioned range listed, please consider follow up with your Primary Care Provider.   ________________________________________________________  [facility] providers would like to encourage you to use MYCHART to communicate with providers for non-urgent requests or questions.  Due to long hold times on the telephone, sending your provider a message by [facility] may be a faster and more efficient way to get a response.  Please allow 48 business hours for a response.  Please remember that this is for non-urgent requests.  _______________________________________________________

## 2024-04-06 NOTE — Telephone Encounter (Signed)
 Pharmacy Patient Advocate Encounter  Received notification from [facility] Medicare that Prior Authorization for Budesonide  ER 9MG  er tablets has been APPROVED from 04-05-2024 to 04-05-2025   PA #/Case ID/Reference #: [id number]

## 2024-04-07 NOTE — Progress Notes (Signed)
 Addendum: Reviewed and agree with assessment and management plan. Asha Grumbine, Carie Caddy, MD

## 2024-04-12 ENCOUNTER — Encounter: Payer: Self-pay | Admitting: Neurology

## 2024-04-12 ENCOUNTER — Ambulatory Visit: Admitting: Neurology

## 2024-04-12 VITALS — BP 136/72 | HR 66 | Ht 69.0 in | Wt 141.8 lb

## 2024-04-12 DIAGNOSIS — G4719 Other hypersomnia: Secondary | ICD-10-CM | POA: Diagnosis not present

## 2024-04-12 DIAGNOSIS — R0683 Snoring: Secondary | ICD-10-CM | POA: Insufficient documentation

## 2024-04-12 DIAGNOSIS — M6289 Other specified disorders of muscle: Secondary | ICD-10-CM | POA: Insufficient documentation

## 2024-04-12 DIAGNOSIS — Z87898 Personal history of other specified conditions: Secondary | ICD-10-CM | POA: Insufficient documentation

## 2024-04-12 DIAGNOSIS — J342 Deviated nasal septum: Secondary | ICD-10-CM | POA: Diagnosis not present

## 2024-04-12 DIAGNOSIS — G478 Other sleep disorders: Secondary | ICD-10-CM | POA: Insufficient documentation

## 2024-04-12 DIAGNOSIS — J310 Chronic rhinitis: Secondary | ICD-10-CM | POA: Insufficient documentation

## 2024-04-12 MED ORDER — ALPRAZOLAM 0.5 MG PO TABS: 0.5000 mg | ORAL_TABLET | Freq: Every evening | ORAL | 0 refills | Status: DC | PRN

## 2024-04-12 NOTE — Progress Notes (Signed)
 SLEEP MEDICINE CLINIC    Provider:  Neomia Banner, [name]  Primary Care Physician:  Lonzie Robins, [name] [address] [place] [address]     Referring Provider: Lonzie Robins, [name] [address] 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  He has used n apple watch that keeps alerting him , he is snoring, and he is more daytime sleepy than he would have been 2 years ago.   Weight has been stabile, no gain.       HISTORY OF PRESENT ILLNESS:  Corey Pike, [name] is a 68 y.o. male patient who is seen upon Dr Gillermina Lacer referral on 04/12/2024  for a sleep medicine consultation. .  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  He has used an apple watch that keeps alerting him , he is snoring, and he is more daytime- sleepy than he would have been 2 years ago.  His sleep is more broken up. He sleeps usually 7 hours but its very restless, in the morning he goes sometimes to a recliner where he catches an extra hour of " nice refreshing REM sleep ".   Weight has been stabile,  he may have lost 6 pounds in the last 2 years ( due to GI problems) certainly no gain.    I have the pleasure of seeing Corey Pike, [name] 04/12/24 a right -handed retired [name] colleague with a possible sleep disorder.     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  Had a single episode of atrial fib, had cardioversion.  Ulcerative colitis since 1993, controlled.  Nocturia twice on desmopressin , no history of Sleep walking, Night terrors, or any other Parasomnia , sinus pain, non allergic rhinitis and  sinusitis, nasal septal deviation.  Cervical spondylosis, shoulder  pain in the right.  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 Tonsillectomy at age 60,  overbite - never wore braces.  Reports loss of muscle mass and strength.      Family medical /sleep history: no other family member on CPAP with OSA, father was a loud snorer,  one causing with sleep apnea who is not obese. No  insomnia, sleep walkers.  mother had inclusion body myositis.    Social history:  Patient is retired from Administrator, arts and lives in a household with spouse, who reported him snoring,  not having apneas.   Family status is married , with 2 biological children, 4 grandchildren.  No pets.  Tobacco use:  none .   ETOH use ; none ,  Caffeine intake in form of Coffee( /) Soda( /) Tea ( 1 cup in PM ) no energy drinks Exercise in form of walking / swimming.        Sleep habits are as follows: The patient's dinner time is between 7.30  PM. The patient goes to bed at 10.30  PM and  reports no RLS, no GERD- continues to sleep in intervals between 30 minutes- 2 hours, used to wake hourly  with nocturia, now in desmopressin .- wakes for 2 bathroom breaks.    Bedroom is  cool, quiet and dark, using a sleep mask.  The preferred sleep position is supine due to shoulder and neck pain , with the support of 1 pillow.  Sleep number bed, memory foam pillow. Likes sleeping in a recliner in AM .  Naps are more frequently taken now, duration of 30 minutes - Dreams are reportedly frequent/vivid, even during short naps   The patient wakes up spontaneously without an alarm, 6 AM - a then adds 1 hours rest in the recliner.  He reports  often not feeling refreshed or restored in AM, with symptoms such as neck stiffness,  and residual fatigue. Naps are taken frequently and are refreshing.   Review of Systems: Out of a complete 14 system review, the patient complains of only the following symptoms, and all other reviewed systems are negative.:  Fatigue, sleepiness , increasingly loud snoring, fragmented sleep, Insomnia,Nocturia     How likely are you to doze in the following situations: 0 = not likely, 1 = slight chance, 2 = moderate chance, 3 = high chance   Sitting and Reading? Watching Television? Sitting inactive in a public place (theater or meeting)? As a passenger in a car for an hour without a break? Lying down in the afternoon when circumstances permit? Sitting and talking to someone? Sitting quietly after lunch without alcohol ? In a car, while stopped for a few minutes in traffic?   Total = 6/ 24 points   FSS endorsed at 19/ 63 points.  GDS :  0  / 15   Social History   Socioeconomic History   Marital status: Married    Spouse name: Not on file   Number of children: 2   Years of education: Not on file   Highest education level: Not on file  Occupational History   Occupation: [name]  Tobacco Use   Smoking status: Never   Smokeless tobacco: Never  Vaping Use   Vaping status: Never Used  Substance and Sexual Activity   Alcohol  use: No   Drug use: No   Sexual activity: Not on file  Other Topics Concern   Not on file  Social History Narrative   Not on file   Social Drivers of Health   Financial Resource Strain: Not on file  Food Insecurity: Low Risk  (02/25/2024)   Received from [facility]   Hunger Vital Sign    Worried About Running Out of Food in the Last Year: Never true    Ran Out of Food in the Last Year: Never true  Transportation Needs: No Transportation Needs (02/25/2024)   Received from [facility]    In the past 12 months, has lack of reliable transportation kept you from medical appointments, meetings, work or from getting things needed for daily living? : No  Physical Activity: Not on file  Stress: Not on file  Social Connections: Not on file    Family History  Problem Relation Age of Onset   Heart disease Father    Colon cancer Neg Hx    Esophageal cancer Neg Hx    Stomach cancer Neg Hx    Rectal cancer Neg Hx     Past Medical History:   Diagnosis Date   Arthritis 03/2020   R hip   Asthma    Elevated PSA    History of adenomatous polyp of colon    Hypercholesteremia    Internal hemorrhoids    Monoallelic mutation of AVP gene    Ulcerative colitis  Past Surgical History:  Procedure Laterality Date   APPENDECTOMY  2009   ATRIAL FIBRILLATION ABLATION     COLONOSCOPY     LEFT HEART CATH AND CORONARY ANGIOGRAPHY N/A 05/06/2018   Procedure: LEFT HEART CATH AND CORONARY ANGIOGRAPHY;  Surgeon: Knox Perl, [name];  Location: [facility] IN[facility]ASIVE [facility];  Service: Cardiovascular;  Laterality: N/A;   LUMBAR LAMINECTOMY  1978     Current Outpatient Medications on File Prior to Visit  Medication Sig Dispense Refill   acetaminophen  (TYLENOL ) 500 MG tablet Take 500-1,000 mg by mouth as needed.     albuterol  (VENTOLIN  HFA) 108 (90 Base) MCG/ACT inhaler Inhale 2 puffs into the lungs every 4 (four) hours as needed. 18 g 3   azelastine  (ASTELIN ) 0.1 % nasal spray Place 1 spray into both nostrils 2 (two) times daily. 30 mL 2   Budesonide  ER (UCERIS ) 9 MG TB24 Take 1 tablet (9 mg total) by mouth daily. 30 tablet 1   Cholecalciferol (VITAMIN D3) 125 MCG (5000 UT) TABS Take 5,000 tablets by mouth. Twice weekly.     desmopressin  (DDAVP ) 0.2 MG tablet Take 1 tablet (0.2 mg total) by mouth 2 (two) times daily as directed (Patient taking differently: Take 0.2 mg by mouth daily.) 60 tablet 12   diclofenac  Sodium (VOLTAREN ) 1 % GEL Apply 2 g topically to the affected area(s) 4 (four) times daily. 100 g 5   esomeprazole  (NEXIUM ) 40 MG capsule Take 1 capsule (40 mg total) by mouth daily at 12 noon. 90 capsule 3   famotidine (PEPCID) 20 MG tablet Take 20 mg by mouth daily as needed.     Fluticasone  Propionate (XHANCE) 93 MCG/ACT EXHU Place 2 sprays into the nose 2 times daily at 12 noon and 4 pm.     hyoscyamine  (LEVSIN SL) 0.125 MG SL tablet Take 1 by mouth every 6-8 hours as needed 30 tablet 2   ipratropium (ATROVENT ) 0.06 % nasal spray Place 2 sprays  into both nostrils 3 (three) times daily. 15 mL 5   mesalamine  (LIALDA ) 1.2 g EC tablet Take 2 tablets (2.4 g total) by mouth in the morning and at bedtime. 360 tablet 1   methocarbamol  (ROBAXIN ) 500 MG tablet Take 1 tablet (500 mg total) by mouth every 8 (eight) hours. (Patient taking differently: Take 500 mg by mouth every 8 (eight) hours as needed.) 90 tablet 1   metoprolol  succinate (TOPROL  XL) 25 MG 24 hr tablet Take 1.5 tablets (37.5 mg total) by mouth daily. 135 tablet 1   nystatin  cream (MYCOSTATIN ) Apply 2-3 times to affected areas as directed External 30 days 30 g 0   pregabalin  (LYRICA ) 25 MG capsule Take 1 capsule (25 mg total) by mouth 2 (two) times daily. (Patient taking differently: Take 25 mg by mouth 2 (two) times daily as needed.) 60 capsule 0   rosuvastatin  (CRESTOR ) 20 MG tablet Take 1 tablet (20 mg total) by mouth daily. 90 tablet 4   Azelastine  HCl 137 MCG/SPRAY SOLN Place 1 spray into each nostril 2 (two) times daily. 30 mL 5   celecoxib  (CELEBREX ) 100 MG capsule Take 1 capsule (100 mg total) by mouth 2 (two) times daily. (Patient not taking: Reported on 04/12/2024) 180 capsule 0   COMIRNATY syringe  (Patient not taking: Reported on 04/12/2024)     fluticasone -salmeterol (ADVAIR ) 100-50 MCG/ACT AEPB Inhale 1 puff into the lungs daily. (Patient not taking: Reported on 04/12/2024) 60 each 11   Zolpidem  Tartrate 1.75 MG SUBL Place 1 tablet under  the tongue once a night. (Patient not taking: Reported on 04/12/2024) 10 tablet 0   No current facility-administered medications on file prior to visit.    Allergies  Allergen Reactions   Amoxicillin-Pot Clavulanate Diarrhea and Nausea Only    Other Reaction(s): GI Intolerance   Cephalexin     Other Reaction(s): other   Doxycycline Diarrhea and Nausea Only    Other Reaction(s): GI Intolerance  doxycycline   Levofloxacin      Other Reaction(s): other   Metronidazole     Other Reaction(s): other   Prednisone      Other Reaction(s):  other  Gastritis.   prednisone      DIAGNOSTIC DATA (LABS, IMAGING, TESTING) - I reviewed patient records, labs, notes, testing and imaging myself where available.  Lab Results  Component Value Date   WBC 6.6 03/21/2024   HGB 14.2 03/21/2024   HCT 43.3 03/21/2024   MCV 92.2 03/21/2024   PLT 207.0 03/21/2024      Component Value Date/Time   NA 139 03/21/2024 1437   K 4.0 03/21/2024 1437   CL 102 03/21/2024 1437   CO2 31 03/21/2024 1437   GLUCOSE 87 03/21/2024 1437   BUN 18 03/21/2024 1437   CREATININE 0.75 03/21/2024 1437   CALCIUM  9.5 03/21/2024 1437   PROT 7.1 03/21/2024 1437   ALBUMIN 3.9 03/21/2024 1437   AST 13 03/21/2024 1437   ALT 12 03/21/2024 1437   ALKPHOS 74 03/21/2024 1437   BILITOT 1.1 03/21/2024 1437   GFRNONAA >60 10/22/2022 0345   GFRAA >90 01/25/2012 0554   No results found for: "CHOL", "HDL", "LDLCALC", "LDLDIRECT", "TRIG", "CHOLHDL" No results found for: "HGBA1C" No results found for: "VITAMINB12" No results found for: "TSH"  PHYSICAL EXAM:  Today's Vitals   04/12/24 1039  BP: 136/72  Pulse: 66  Weight: 141 lb 12.8 oz (64.3 kg)  Height: 5\' 9"  (1.753 m)   Body mass index is 20.94 kg/m.   Wt Readings from Last 3 Encounters:  04/12/24 141 lb 12.8 oz (64.3 kg)  04/05/24 141 lb 9.6 oz (64.2 kg)  03/21/24 142 lb 2 oz (64.5 kg)     Ht Readings from Last 3 Encounters:  04/12/24 5\' 9"  (1.753 m)  04/05/24 5\' 9"  (1.753 m)  03/21/24 5\' 8"  (1.727 m)      General: The patient is awake, alert and appears not in acute distress. The patient is well groomed. Head: Normocephalic, atraumatic.  Neck is supple.  Mallampati 2-3,  neck circumference:14 inches .  Nasal airflow only right nostril was fully patent.  Deviated septum present.  Overbite present.   Dental status:  biological  Cardiovascular:  Regular rate and cardiac rhythm by pulse,  without distended neck veins. Respiratory: Lungs are clear to auscultation.  Skin:  Without evidence of  ankle edema, or rash. Trunk: The patient's posture is erect.   NEUROLOGIC EXAM: The patient is awake and alert, oriented to place and time.   Memory subjective described as intact.  Attention span & concentration ability appears normal.  Speech is fluent,  without dysarthria, dysphonia or aphasia.  Mood and affect are appropriate.   Cranial nerves: no loss of smell or taste reported  Pupils are equal and briskly reactive to light. Funduscopic exam deferred.  Extraocular movements in vertical and horizontal planes were intact and without nystagmus. No Diplopia. Visual fields by finger perimetry are intact.  Hearing was intact to soft voice and finger rubbing.    Facial sensation intact to fine touch.  Facial motor strength is symmetric and tongue and uvula move midline.  Neck ROM : rotation, tilt and flexion extension were normal for age and shoulder shrug was symmetrical.    Motor exam:  Symmetric bulk, tone and ROM.   loss of thenar eminence on the right hand, weakness of grip in the right hand.  Normal tone without cog -wheeling,    Sensory:  reported as intact . History of right radiculopathic  pain in the right arm, shoulder.   Coordination: Rapid alternating movements in the fingers/hands were of normal speed.  The Finger-to-nose maneuver was intact without evidence of ataxia, dysmetria or tremor.   Gait and station: Patient could rise unassisted from a seated position, walked without assistive device.  Stance is of normal width/ base .  Toe and heel walk were deferred.  Deep tendon reflexes: in the  upper and lower extremities are symmetric and intact.  Babinski response was deferred.    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  Sleeps better in a recliner which may indicate a hypoventilation condition.    2) chronic pain, mild - but affecting  sleep position.   3) his wife reported  loud snoring but no witnessed apneas.  Risk factor for OSA : restricted nasal airway , non allergic.  Overbite, small crowded oral cavity . He has a small neck size at 14 inches and has normal BMI of 20.5.    4)  single episode of atrial fib in the past, had needed cardioversion.   5)  frequent nocturia improved on desmopressin .  Unexplained cause of frequent urination .   6) extremely high CRP-  28.000 in 2023, colitis  break through at the time, certainly a fatiguing condition. Auto immune.   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    I ordered a SPLIT night with Xanax, or he can bring and take Ambien  form home 9 a prn prescription he has for several years)    I plan to follow up either personally within 4 months.   I would like to thank Avva, Ravisankar, [name] and Avva, Ravisankar, [name] [address] [place],  [place] [address] for allowing me to meet with and to take care of this pleasant patient.     After spending a total time of  45  minutes face to face and additional time for physical and neurologic examination, review of laboratory studies,  personal review of imaging studies, reports and results of other testing and review of referral information / records as far as provided in visit,   Electronically signed by: Neomia Banner, [name] 04/12/2024 10:49 AM  [facility] Board certified by The [employer] of Sleep Medicine and Diplomate of the [employer] of [employer]. Board certified In Neurology through the ABPN, Fellow of the [employer] of [employer].

## 2024-04-12 NOTE — Patient Instructions (Addendum)
      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   Sleeps better in a recliner which may indicate a hypoventilation condition.     2) chronic pain, mild - but affecting sleep position.    3) his wife reported  loud snoring but no witnessed apneas.  Risk factor for OSA : restricted nasal airway , non allergic.  Overbite, small crowded oral cavity . He has a small neck size at 14 inches and has normal BMI of 20.5.    4)  single episode of atrial fib in the past, had needed cardioversion.    5)  frequent nocturia improved on desmopressin .  Unexplained cause of frequent urination .    6) extremely high CRP-  28.000 in 2023, colitis  break through at the time, certainly a fatiguing condition. Auto immune.  SPEP and CK -CKMB ordered.    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      I ordered a SPLIT night with Xanax, or he can bring and take Ambien  form home 9 a prn prescription he has for several years)      I plan to follow up either personally within 4 months.

## 2024-04-13 DIAGNOSIS — M2170 Unequal limb length (acquired), unspecified site: Secondary | ICD-10-CM | POA: Diagnosis not present

## 2024-04-14 ENCOUNTER — Telehealth: Payer: Self-pay | Admitting: Neurology

## 2024-04-14 ENCOUNTER — Encounter: Payer: Self-pay | Admitting: Neurology

## 2024-04-14 LAB — MULTIPLE MYELOMA PANEL, SERUM
Albumin SerPl Elph-Mcnc: 3.4 g/dL (ref 2.9–4.4)
Albumin/Glob SerPl: 1.1 (ref 0.7–1.7)
Alpha 1: 0.3 g/dL (ref 0.0–0.4)
Alpha2 Glob SerPl Elph-Mcnc: 0.8 g/dL (ref 0.4–1.0)
B-Globulin SerPl Elph-Mcnc: 1 g/dL (ref 0.7–1.3)
Gamma Glob SerPl Elph-Mcnc: 1.3 g/dL (ref 0.4–1.8)
Globulin, Total: 3.4 g/dL (ref 2.2–3.9)
IgA/Immunoglobulin A, Serum: 151 mg/dL (ref 61–437)
IgG (Immunoglobin G), Serum: 1259 mg/dL (ref 603–1613)
IgM (Immunoglobulin M), Srm: 231 mg/dL — ABNORMAL HIGH (ref 20–172)
Total Protein: 6.8 g/dL (ref 6.0–8.5)

## 2024-04-14 LAB — CK TOTAL AND CKMB (NOT AT ARMC)
CK-MB Index: 2.6 ng/mL (ref 0.0–10.4)
Total CK: 59 U/L (ref 41–331)

## 2024-04-14 NOTE — Telephone Encounter (Signed)
 [employer] medicare no auth req via Nurse, adult

## 2024-04-15 DIAGNOSIS — M25552 Pain in left hip: Secondary | ICD-10-CM | POA: Diagnosis not present

## 2024-04-15 DIAGNOSIS — M1611 Unilateral primary osteoarthritis, right hip: Secondary | ICD-10-CM | POA: Diagnosis not present

## 2024-04-17 IMAGING — MR MR HEAD WO/W CM
20 of 22 series · 46 of 48 positions shown · IV contrast (multihance)
Comparison: None Available.

CLINICAL DATA: Polyuria RJD.WX (3N2-5B-CM). Other fatigue
(3N2-5B-CM).

EXAM:
MRI HEAD WITHOUT AND WITH CONTRAST
TECHNIQUE: Multiplanar, multiecho pulse sequences of the brain and surrounding
structures were obtained without and with intravenous contrast.
CONTRAST:  7mL MULTIHANCE GADOBENATE DIMEGLUMINE 529 MG/ML IV SOLN

[Series 6: T1 · sagittal · 4.0mm · 0.81mm/px · 1 of 31 slices shown (1 of 5)]
[im 1/31]
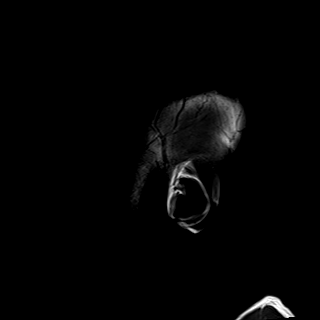

[Series 7: DWI · axial · 3.0mm · 0.98mm/px · z∈[-40,+107]mm · 8 of 176 slices shown]
[im 1/176]
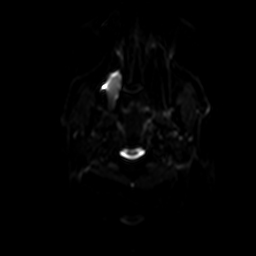
[im 26/176]
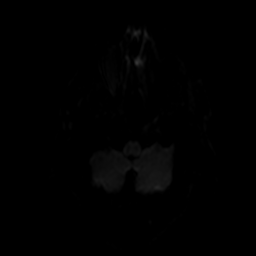
[im 51/176]
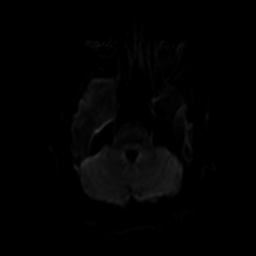
[im 76/176]
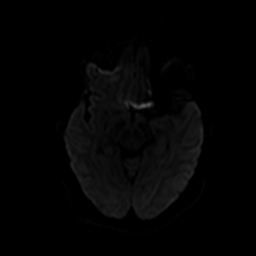
[im 101/176]
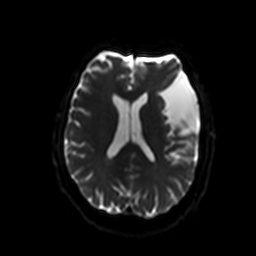
[im 126/176]
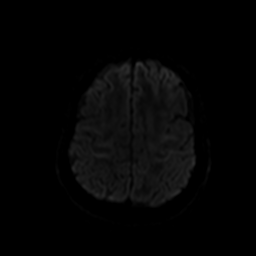
[im 151/176]
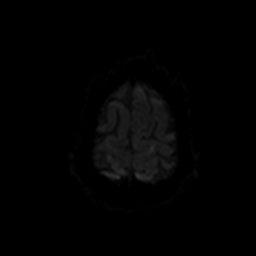
[im 176/176]
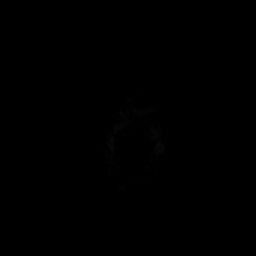

[Series 8: ax dwi_tracew · axial · 3.0mm · 0.98mm/px · z∈[-40,+107]mm · 4 of 88 slices shown]
[im 1/88]
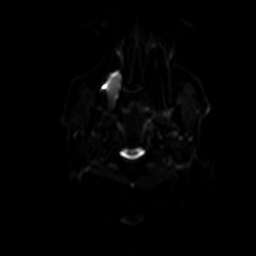
[im 30/88]
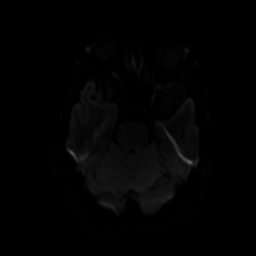
[im 59/88]
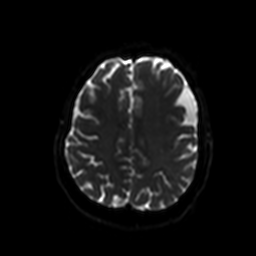
[im 88/88]
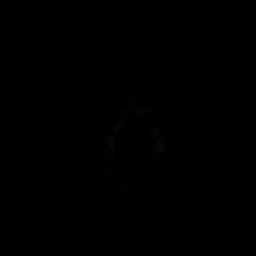

[Series 9: ax dwi_adc · axial · 3.0mm · 0.98mm/px · 1 of 43 slices shown]
[im 1/43]
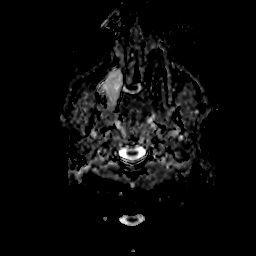

[Series 10: T2 post-contrast · coronal · 4.0mm · 0.36mm/px · 2 of 35 slices shown]
[im 1/35]
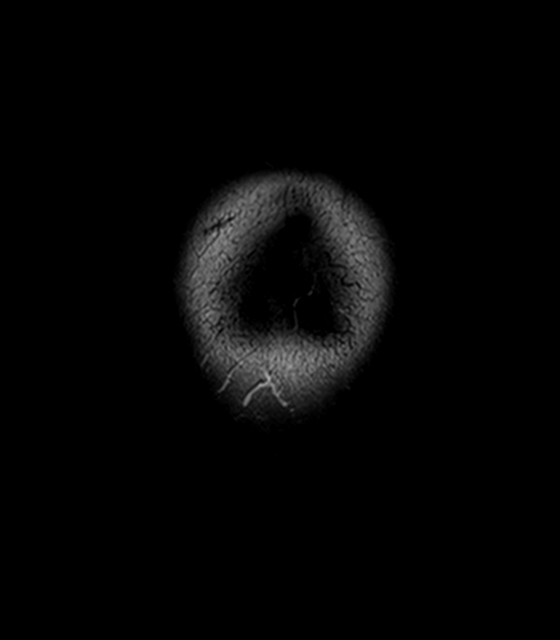
[im 35/35]
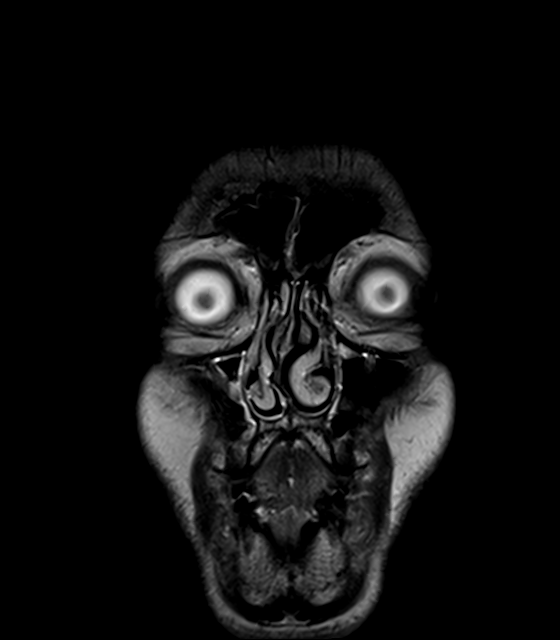

[Series 11: T2 · axial · 4.0mm · 0.38mm/px · z∈[-41,+106]mm · 2 of 31 slices shown (1 of 2)]
[im 1/31]
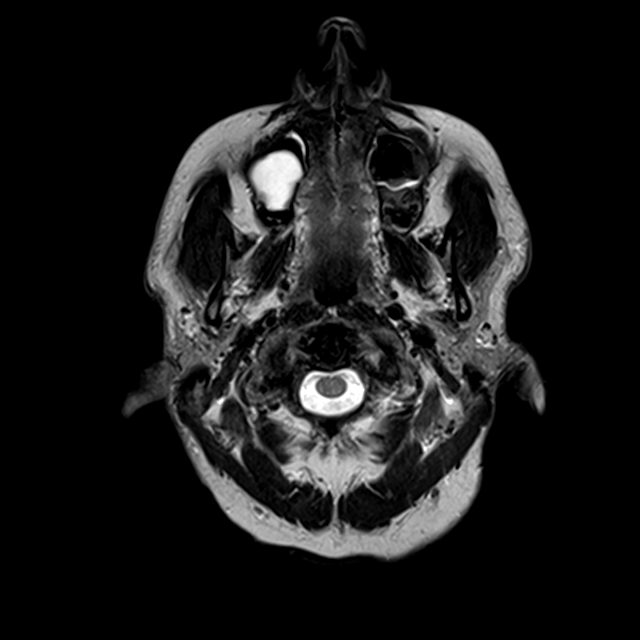
[im 31/31]
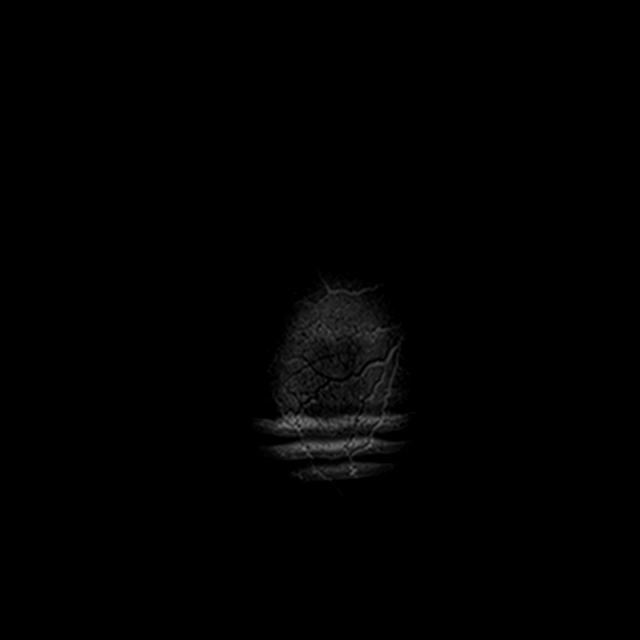

[Series 12: mip_images(sw) · axial · 12.0mm · 0.94mm/px · z∈[-30,+105]mm · 5 of 97 slices shown]
[im 1/97]
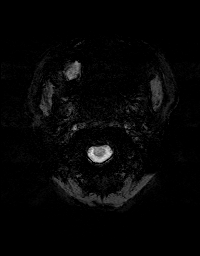
[im 25/97]
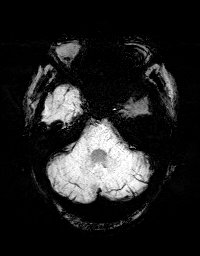
[im 49/97]
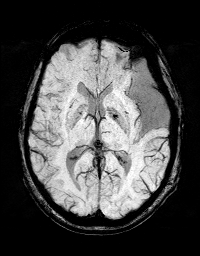
[im 73/97]
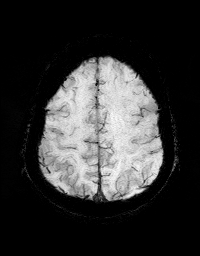
[im 97/97]
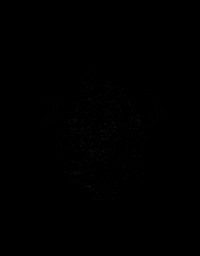

[Series 13: swi_images · axial · 1.5mm · 0.94mm/px · z∈[-35,+110]mm · 5 of 104 slices shown]
[im 1/104]
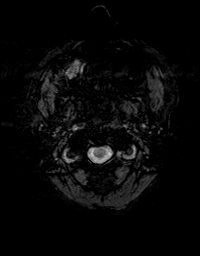
[im 26/104]
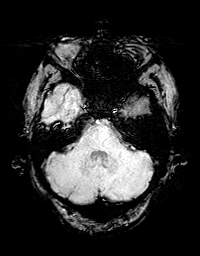
[im 52/104]
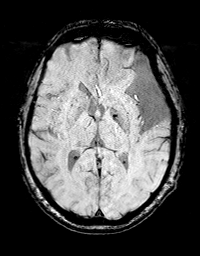
[im 78/104]
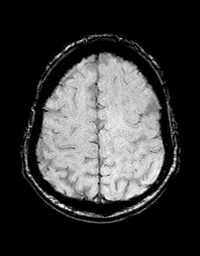
[im 104/104]
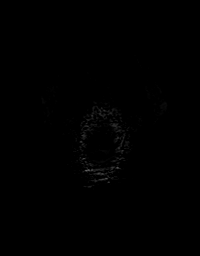

[Series 14: FLAIR · axial · 3.0mm · 0.75mm/px · 1 of 27 slices shown]
[im 1/27]
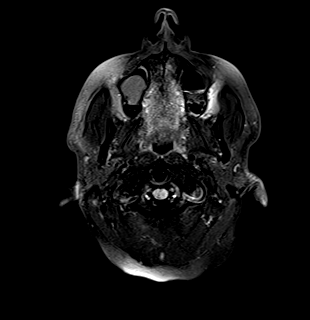

[Series 15: T1 · sagittal · 3.0mm · 0.50mm/px · 1 of 13 slices shown (2 of 5)]
[im 1/13]
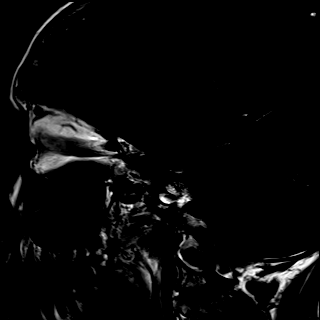

[Series 16: T2 · coronal · 3.0mm · 0.50mm/px · 1 of 13 slices shown (2 of 2)]
[im 1/13]
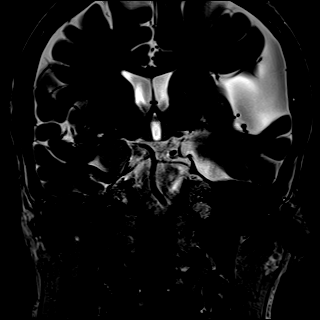

[Series 17: T1 · coronal · 3.0mm · 0.50mm/px · 1 of 13 slices shown (3 of 5)]
[im 1/13]
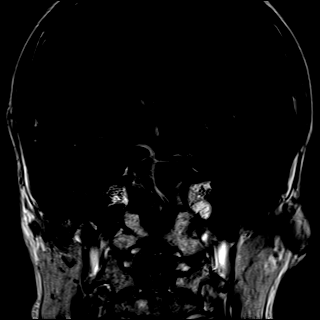

[Series 18: T1 · coronal · non-contrast · 3.0mm · 0.62mm/px · 1 of 13 slices shown (4 of 5)]
[im 1/13]
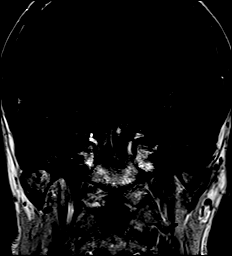

[Series 19: cor post dyn · coronal · 3.0mm · 0.62mm/px · 1 of 13 slices shown (1 of 4)]
[im 1/13]
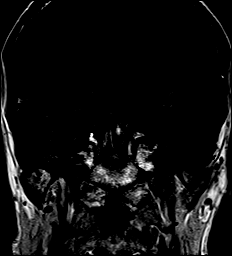

[Series 20: cor post dyn · coronal · 3.0mm · 0.62mm/px · 1 of 13 slices shown (2 of 4)]
[im 1/13]
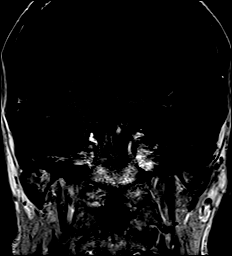

[Series 21: cor post dyn · coronal · 3.0mm · 0.62mm/px · 1 of 13 slices shown (3 of 4)]
[im 1/13]
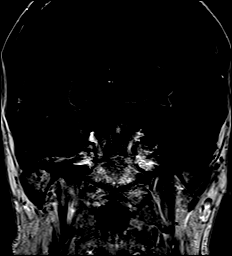

[Series 22: cor post dyn · coronal · 3.0mm · 0.62mm/px · 1 of 13 slices shown (4 of 4)]
[im 1/13]
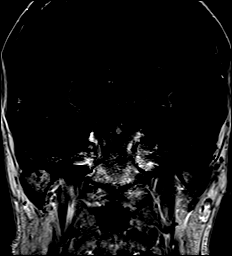

[Series 25: T1 post-contrast · sagittal · 3.0mm · 0.50mm/px · 1 of 13 slices shown (1 of 2)]
[im 1/13]
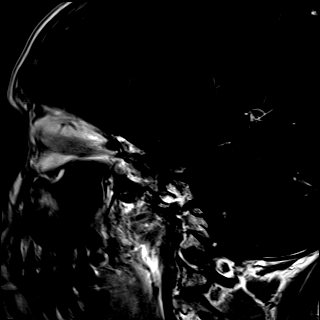

[Series 26: T1 post-contrast · coronal · 3.0mm · 0.50mm/px · 1 of 13 slices shown (2 of 2)]
[im 1/13]
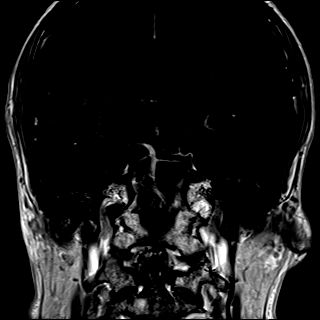

[Series 27: T1 · axial · 1.0mm · 0.94mm/px · z∈[-34,+102]mm · 7 of 144 slices shown (5 of 5)]
[im 1/144]
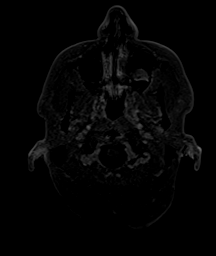
[im 24/144]
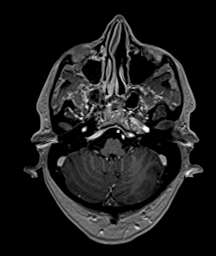
[im 48/144]
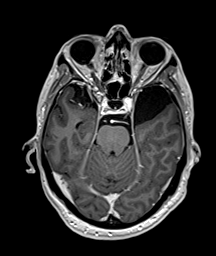
[im 72/144]
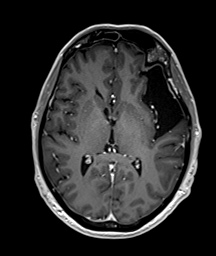
[im 96/144]
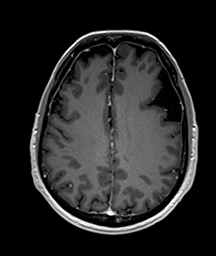
[im 120/144]
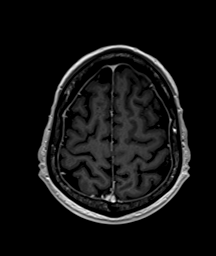
[im 144/144]
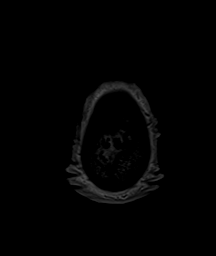

[46 of 48 positions shown; findings below may reference images not displayed]

FINDINGS: Brain: Prominent widening of the subarachnoid space in the anterior
aspect of the left middle cranial fossa and anterior cranial fossa
with signal characteristics similar to CSF in all sequences,
measuring approximately 7.2 x 5.8 x 2.8 cm (AP, cc, T) most
consistent with an arachnoid cyst. There is mass effect with
superior and medial placement of the left frontal lobe and posterior
displacement of the left temporal lobe. The brain parenchyma has
normal signal characteristics.

No acute infarct, hemorrhage or hydrocephalus.

Pituitary/Sella: A partial empty sella is noted with decreased
volume the pituitary gland. A hypoenhancing 5 mm focus is seen on
the left side of the pituitary gland on the dynamic contrast images
(series 24, image 5) with homogeneous enhancement on the delayed
contrast phase. The infundibulum is midline. The hypothalamus and
mamillary bodies are normal. There is no mass effect on the optic
chiasm or optic nerves. The infundibular and chiasmatic recesses are
clear. Normal cavernous sinus and cavernous internal carotid artery
flow voids.

Vascular: Normal flow voids.

Skull and upper cervical spine: Decreased T1 signal in the
visualized upper cervical spine, may represent red marrow
reconversion. However, marrow replacement pathologies cannot be
entirely excluded. Thickening of the left anterior clinoid process,
orbital roof and sphenoid wing.

Sinuses/Orbits: Right mastoid effusion. Mucosal thickening scattered
throughout the paranasal sinuses with large mucous retention cyst in
the right maxillary sinus. A T1 and T2 hypointense, enhancing lesion
is seen in the posterior aspect of the left maxillary sinus
containing unerupted tooth.

Other: None.
IMPRESSION: 1. Findings suggestive of a large arachnoid cyst in the anterior
aspect of the left middle cranial fossa and anterior cranial fossa
causing mass effect on the brain parenchyma. No edema or significant
midline shift.
2. A 5 mm Hypoenhancing focus on the left side of the pituitary
gland may represent a pituitary adenoma.
3. Decreased T1 signal of the visualized upper cervical spine may
represent red marrow reconversion. However, marrow replacement
pathologies cannot be excluded.
4. Enhancing lesion associated unerupted tooth in the left maxillary
sinus. Correlation with dedicated paranasal sinus CT recommended.

## 2024-05-02 DIAGNOSIS — J300 Vasomotor rhinitis: Secondary | ICD-10-CM | POA: Diagnosis not present

## 2024-05-02 DIAGNOSIS — R052 Subacute cough: Secondary | ICD-10-CM | POA: Diagnosis not present

## 2024-05-05 ENCOUNTER — Encounter: Payer: Self-pay | Admitting: Internal Medicine

## 2024-05-05 ENCOUNTER — Other Ambulatory Visit: Payer: Self-pay | Admitting: Internal Medicine

## 2024-05-05 DIAGNOSIS — K5150 Left sided colitis without complications: Secondary | ICD-10-CM

## 2024-05-08 DIAGNOSIS — M25552 Pain in left hip: Secondary | ICD-10-CM | POA: Diagnosis not present

## 2024-05-10 ENCOUNTER — Telehealth: Payer: Self-pay

## 2024-05-10 NOTE — Telephone Encounter (Signed)
 Dr. Bridgett Camps, patient will be scheduled as soon as possible.  Auth Submission: APPROVED Site of care: Site of care: [facility] Payer: BCBS medicare Medication & CPT/J Code(s) submitted: Entyvio (Vedolizumab) [id number] Route of submission (phone, fax, portal): fax  Phone # Fax #  [phone] Auth type: Buy/Bill PB Units/visits requested: 300mg  x 6 doses Reference number: [id number] Approval from: 05/05/24 to 12/13/24

## 2024-05-13 DIAGNOSIS — M1612 Unilateral primary osteoarthritis, left hip: Secondary | ICD-10-CM | POA: Diagnosis not present

## 2024-05-19 ENCOUNTER — Ambulatory Visit

## 2024-05-19 VITALS — BP 138/83 | HR 73 | Temp 98.2°F | Resp 16 | Ht 69.0 in | Wt 143.6 lb

## 2024-05-19 DIAGNOSIS — K5150 Left sided colitis without complications: Secondary | ICD-10-CM | POA: Diagnosis not present

## 2024-05-19 DIAGNOSIS — K529 Noninfective gastroenteritis and colitis, unspecified: Secondary | ICD-10-CM | POA: Diagnosis not present

## 2024-05-19 MED ORDER — VEDOLIZUMAB 300 MG IV SOLR
300.0000 mg | Freq: Once | INTRAVENOUS | Status: AC
  Administered 2024-05-19: 300 mg via INTRAVENOUS
  Filled 2024-05-19: qty 5

## 2024-05-19 NOTE — Progress Notes (Signed)
 Diagnosis: Left-sided colitis  Provider:  Mannam, Praveen MD  Procedure: IV Infusion  IV Type: Peripheral, IV Location: R Antecubital  Entyvio (Vedolizumab), Dose: 300 mg  Infusion Start Time: 1415  Infusion Stop Time: 1448  Post Infusion IV Care: Observation period completed and Peripheral IV Discontinued  Discharge: Condition: Good, Destination: Home . AVS Declined  Performed by:  Lendel Quant, RN

## 2024-05-22 NOTE — Progress Notes (Signed)
 " Cardiology Office Note:  .   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 He underwent cardiac catheterization on 05/06/2018 and was found to have moderate coronary artery disease involving the LAD and circumflex.   He has also had one episode of atrial fibrillation on 07/04/2018 and underwent same day direct-current cardioversion and was on anticoagulation for 2 to 3 months and was discontinued due to lack of any other risk factors.  He has been on metoprolol  succinate for PACs and palpitations.  He presents to reestablish care, noticed significant bradycardia with heart rate dropping down to 39 bpm hence beta-blocker dose was reduced and eventually discontinued after discussions with me.  Presently asymptomatic with occasional palpitations.  He has been scheduled for sleep study as well.  Discussed the use of AI scribe software for clinical note transcription with the patient, who gave verbal consent to proceed.  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  He experiences episodes of low heart rate, with nocturnal rates around 50 without metoprolol . On metoprolol , his heart rate decreased to 38-39, leading to discontinuation of the medication three to four weeks ago. Since stopping metoprolol , palpitations have increased, occurring two to three days a week, sometimes lasting most of the  day.  Palpitations are most bothersome mid-morning, with resting heart rates in the 80s, increasing to 100 with minimal exertion. An Apple device shows heart rate variability and episodes of low heart rate. During workouts, his heart rate can reach 130, which is higher than expected for mild exercise. He occasionally experiences irregular heartbeats, described as 'SVT type of thing' that resolves with coughing.  He has a history of taking metoprolol , which he reduced and stopped due to low heart rates. He occasionally uses 12.5 mg of metoprolol  to manage symptoms, resulting in heart rates below 45. He is scheduled for a sleep study to further evaluate his symptoms. He has increased his exercise regimen, tolerating it well, and continues to swim almost every day.  His past medical history includes a heart catheterization in 2019 showing very mild plaque and an echocardiogram from 2019 with normal LV function and very mild MR.  Labs    Lab Results  Component Value Date   NA 139 03/21/2024   K 4.0 03/21/2024   CO2 31 03/21/2024   GLUCOSE 87 03/21/2024   BUN 18 03/21/2024   CREATININE 0.75 03/21/2024   CALCIUM  9.5 03/21/2024   GFR 91.92 03/21/2024   GFRNONAA >60 10/22/2022      Latest Ref Rng & Units 03/21/2024    2:37 PM 11/13/2022    5:03 PM 10/22/2022    3:45 AM  BMP  Glucose 70 - 99 mg/dL 87  80  894   BUN 6 - 23 mg/dL 18  11  13    Creatinine 0.40 - 1.50 mg/dL 9.24  9.19  9.03   Sodium 135 - 145 mEq/L 139  135  133   Potassium 3.5 - 5.1 mEq/L 4.0  4.1  4.5   Chloride 96 - 112 mEq/L 102  101  98   CO2 19 - 32 mEq/L 31  28  25    Calcium  8.4 - 10.5 mg/dL 9.5  9.2  9.8       Latest Ref Rng & Units 03/21/2024    2:37 PM 11/13/2022    5:03 PM 10/22/2022    3:45 AM  CBC  WBC 4.0 - 10.5 K/uL 6.6  5.2  9.0   Hemoglobin 13.0 - 17.0 g/dL 85.7  85.3  83.7   Hematocrit 39.0 - 52.0 % 43.3  43.0  45.6   Platelets 150.0 - 400.0 K/uL 207.0  244.0  267    External Labs:  [facility] labs  05/19/2022:  Total cholesterol 12/15/1938, triglycerides 64, HDL 66, LDL 59.  TSH normal at 0.750.  ROS  Review of Systems  Cardiovascular:  Positive for palpitations (brief). Negative for chest pain, dyspnea on exertion and leg swelling.    Physical Exam:   VS:  BP 120/82   Pulse 68   Ht 5' 9 (1.753 m)   Wt 144 lb 12.8 oz (65.7 kg)   SpO2 99%   BMI 21.38 kg/m    Wt Readings from Last 3 Encounters:  05/23/24 144 lb 12.8 oz (65.7 kg)  05/19/24 143 lb 9.6 oz (65.1 kg)  04/12/24 141 lb 12.8 oz (64.3 kg)    Physical Exam Neck:     Vascular: No carotid bruit or JVD.  Cardiovascular:     Rate and Rhythm: Normal rate and regular rhythm.     Pulses: Intact distal pulses.     Heart sounds: Normal heart sounds. No murmur heard.    No gallop.  Pulmonary:     Effort: Pulmonary effort is normal.     Breath sounds: Normal breath sounds.  Abdominal:     General: Bowel sounds are normal.     Palpations: Abdomen is soft.  Musculoskeletal:     Right lower leg: No edema.     Left lower leg: No edema.    Studies Reviewed: SABRA    CARDIAC CATHETERIZATION 05/06/2018  Intervention Slow flow evident improved with IC nitro.   Impression: Patient symptoms may have been related to unstable plaque which is now stabilized.  Patient did have chest pain with passage of guidewire and also slow flow in the LAD which improved with nitroglycerin .  Would recommend very aggressive statin therapy along with vasodilator therapy.   Echocardiogram 05/04/2018: 1. Left ventricle cavity is normal in size. Normal left ventricular shape. Normal global wall motion. Normal diastolic filling pattern. LVEF60%. 2. Trileaflet aortic valve with mild (Grade I) regurgitation. 3. Mild mitral valve leaflet thickening. Borderline prolapse of the mitral valve leaflets. Mild (Grade I) mitral regurgitation. 4. Mild tricuspid regurgitation. No evidence of pulmonary hypertension. 5. Occasional PVC noted during entire study.    EKG:    EKG Interpretation Date/Time:  Monday May 23 2024 10:48:34 EDT Ventricular Rate:  68 PR Interval:  154 QRS Duration:  86 QT Interval:  406 QTC Calculation: 431 R Axis:   89  Text Interpretation: EKG 05/23/2024: Normal sinus rhythm at the rate of 68 bpm, normal EKG.  No significant change from 07/04/2018, previous heart rate was 70 bpm. Confirmed by Sterlin Knightly, Jagadeesh ([id number]) on 05/23/2024 10:56:57 AM    Medications and allergies    Allergies  Allergen Reactions   Amoxicillin-Pot Clavulanate Diarrhea and Nausea Only  Other Reaction(s): GI Intolerance   Cephalexin     Other Reaction(s): other   Doxycycline Diarrhea and Nausea Only    Other Reaction(s): GI Intolerance  doxycycline   Levofloxacin      Other Reaction(s): other   Metronidazole     Other Reaction(s): other   Prednisone      Other Reaction(s): other  Gastritis.   prednisone      Current Outpatient Medications:    acetaminophen  (TYLENOL ) 500 MG tablet, Take 500-1,000 mg by mouth as needed., Disp: , Rfl:    albuterol  (VENTOLIN  HFA) 108 (90 Base) MCG/ACT inhaler, Inhale 2 puffs into the lungs every 4 (four) hours as needed., Disp: 18 g, Rfl: 3   ALPRAZolam  (XANAX ) 0.5 MG tablet, Take 1 tablet (0.5 mg total) by mouth at bedtime as needed for anxiety., Disp: 2 tablet, Rfl: 0   azelastine  (ASTELIN ) 0.1 % nasal spray, Place 1 spray into both nostrils 2 (two) times daily., Disp: 30 mL, Rfl: 2   Budesonide  ER (UCERIS ) 9 MG TB24, Take 1 tablet (9 mg total) by mouth daily., Disp: 30 tablet, Rfl: 1   Cholecalciferol (VITAMIN D3) 125 MCG (5000 UT) TABS, Take 5,000 tablets by mouth. Twice weekly., Disp: , Rfl:    desmopressin  (DDAVP ) 0.2 MG tablet, Take 1 tablet (0.2 mg total) by mouth 2 (two) times daily as directed (Patient taking differently: Take 0.2 mg by mouth daily.), Disp: 60 tablet, Rfl: 12   diclofenac  Sodium (VOLTAREN ) 1 % GEL, Apply 2 g topically to the affected area(s) 4 (four) times daily., Disp: 100 g, Rfl:  5   esomeprazole  (NEXIUM ) 40 MG capsule, Take 1 capsule (40 mg total) by mouth daily at 12 noon., Disp: 90 capsule, Rfl: 3   famotidine (PEPCID) 20 MG tablet, Take 20 mg by mouth daily as needed., Disp: , Rfl:    Fluticasone  Propionate (XHANCE) 93 MCG/ACT EXHU, Place 2 sprays into the nose 2 times daily at 12 noon and 4 pm., Disp: , Rfl:    hyoscyamine  (LEVSIN  SL) 0.125 MG SL tablet, Take 1 by mouth every 6-8 hours as needed, Disp: 30 tablet, Rfl: 2   ipratropium (ATROVENT ) 0.06 % nasal spray, Place 2 sprays into both nostrils 3 (three) times daily., Disp: 15 mL, Rfl: 5   mesalamine  (LIALDA ) 1.2 g EC tablet, Take 2 tablets (2.4 g total) by mouth in the morning and at bedtime., Disp: 360 tablet, Rfl: 1   metoprolol  succinate (TOPROL  XL) 25 MG 24 hr tablet, Take 1.5 tablets (37.5 mg total) by mouth daily., Disp: 135 tablet, Rfl: 1   metoprolol  tartrate (LOPRESSOR ) 25 MG tablet, Take 0.5 tablets (12.5 mg total) by mouth 2 (two) times daily as needed., Disp: 90 tablet, Rfl: 3   nystatin  cream (MYCOSTATIN ), Apply 2-3 times to affected areas as directed External 30 days, Disp: 30 g, Rfl: 0   rosuvastatin  (CRESTOR ) 20 MG tablet, Take 1 tablet (20 mg total) by mouth daily., Disp: 90 tablet, Rfl: 4   celecoxib  (CELEBREX ) 100 MG capsule, Take 1 capsule (100 mg total) by mouth 2 (two) times daily. (Patient not taking: Reported on 04/05/2024), Disp: 180 capsule, Rfl: 0   COMIRNATY syringe, , Disp: , Rfl:    fluticasone -salmeterol (ADVAIR ) 100-50 MCG/ACT AEPB, Inhale 1 puff into the lungs daily. (Patient not taking: Reported on 05/23/2024), Disp: 60 each, Rfl: 11   methocarbamol  (ROBAXIN ) 500 MG tablet, Take 1 tablet (500 mg total) by mouth every 8 (eight) hours. (Patient not taking: Reported on 05/23/2024), Disp: 90 tablet,  Rfl: 1   Zolpidem  Tartrate 1.75 MG SUBL, Place 1 tablet under the tongue once a night. (Patient not taking: Reported on 05/23/2024), Disp: 10 tablet, Rfl: 0   Meds ordered this encounter   Medications   metoprolol  tartrate (LOPRESSOR ) 25 MG tablet    Sig: Take 0.5 tablets (12.5 mg total) by mouth 2 (two) times daily as needed.    Dispense:  90 tablet    Refill:  3     There are no discontinued medications.   ASSESSMENT AND PLAN: .      ICD-10-CM   1. Bradycardia by electrocardiogram  R00.1 EKG 12-Lead    2. Palpitations  R00.2 metoprolol  tartrate (LOPRESSOR ) 25 MG tablet    3. H/O atrial fibrillation without current medication  Z86.79 EKG 12-Lead    4. Hypercholesteremia  E78.00       Assessment and Plan Assessment & Plan Palpitations Palpitations have increased since discontinuing metoprolol , with episodes of brief supraventricular tachycardia (SVT) like presentation improved with cough and premature atrial contractions (PACs). Symptoms are bothersome, occurring more frequently, especially mid-morning. He has been using metoprolol  as needed. A sleep study is scheduled to evaluate for potential sleep apnea, which could contribute to palpitations. If the sleep study is negative and palpitations persist, cardiac monitoring will be considered. - Conduct sleep study to evaluate for sleep apnea. - Prescribe metoprolol  tartrate as needed for palpitations, up to twice a day. - Consider cardiac monitoring if palpitations persist and sleep study is negative.  Bradycardia Bradycardia is likely related to excellent exercise tolerance and swimming. Heart rate has been low, especially at night, with episodes as low as 39 bpm when on metoprolol . Since discontinuing metoprolol , heart rate has increased but remains low at times, which is not concerning given his athletic background. The low heart rate is not suspected to be due to atrial fibrillation recurrence as EKG shows no left atrial enlargement. - Discontinue metoprolol  succinate. - Prescribe metoprolol  tartrate as needed for palpitations, up to twice a day.  Mild mitral regurgitation Mild mitral regurgitation noted on  echocardiogram from 2019. No current symptoms or concerns. Echocardiogram showed normal left ventricular function and very mild mitral regurgitation.  Coronary atherosclerosis Coronary atherosclerosis with mild plaque noted on cardiac catheterization in 2019. He is on statins with excellent lipid control. Recent lipid panel showed LDL of 59 mg/dL. No current symptoms or concerns. - Continue statin therapy. - Monitor lipid levels as scheduled with primary care provider.  Will see him back as needed.   Signed,  Gordy Bergamo, MD, [facility] 05/23/2024, 11:14 AM [facility] [address] [place], [place] [address] Phone: [phone]. Fax:  [phone]  "

## 2024-05-23 ENCOUNTER — Ambulatory Visit: Attending: Cardiology | Admitting: Cardiology

## 2024-05-23 ENCOUNTER — Encounter: Payer: Self-pay | Admitting: Cardiology

## 2024-05-23 ENCOUNTER — Encounter

## 2024-05-23 VITALS — BP 120/82 | HR 68 | Ht 69.0 in | Wt 144.8 lb

## 2024-05-23 DIAGNOSIS — E7800 Pure hypercholesterolemia, unspecified: Secondary | ICD-10-CM

## 2024-05-23 DIAGNOSIS — Z8679 Personal history of other diseases of the circulatory system: Secondary | ICD-10-CM | POA: Diagnosis not present

## 2024-05-23 DIAGNOSIS — R001 Bradycardia, unspecified: Secondary | ICD-10-CM | POA: Diagnosis not present

## 2024-05-23 DIAGNOSIS — R002 Palpitations: Secondary | ICD-10-CM | POA: Diagnosis not present

## 2024-05-23 MED ORDER — METOPROLOL TARTRATE 25 MG PO TABS: 12.5000 mg | ORAL_TABLET | Freq: Two times a day (BID) | ORAL | 3 refills | Status: DC | PRN

## 2024-05-23 NOTE — Patient Instructions (Signed)
 Medication Instructions:  The current medical regimen is effective;  continue present plan and medications.  *If you need a refill on your cardiac medications before your next appointment, please call your pharmacy*   Follow-Up: At [facility], you and your health needs are our priority.  As part of our continuing mission to provide you with exceptional heart care, our providers are all part of one team.  This team includes your primary Cardiologist (physician) and Advanced Practice Providers or APPs (Physician Assistants and Nurse Practitioners) who all work together to provide you with the care you need, when you need it.  Your next appointment:   Follow up as needed   We recommend signing up for the patient portal called "MyChart".  Sign up information is provided on this After Visit Summary.  MyChart is used to connect with patients for Virtual Visits (Telemedicine).  Patients are able to view lab/test results, encounter notes, upcoming appointments, etc.  Non-urgent messages can be sent to your provider as well.   To learn more about what you can do with MyChart, go to [email or web address].

## 2024-05-24 ENCOUNTER — Ambulatory Visit (INDEPENDENT_AMBULATORY_CARE_PROVIDER_SITE_OTHER): Admitting: Neurology

## 2024-05-24 DIAGNOSIS — M6289 Other specified disorders of muscle: Secondary | ICD-10-CM

## 2024-05-24 DIAGNOSIS — J310 Chronic rhinitis: Secondary | ICD-10-CM

## 2024-05-24 DIAGNOSIS — R0683 Snoring: Secondary | ICD-10-CM | POA: Diagnosis not present

## 2024-05-24 DIAGNOSIS — G4719 Other hypersomnia: Secondary | ICD-10-CM

## 2024-05-24 DIAGNOSIS — J342 Deviated nasal septum: Secondary | ICD-10-CM

## 2024-05-24 DIAGNOSIS — G478 Other sleep disorders: Secondary | ICD-10-CM

## 2024-05-24 DIAGNOSIS — Z87898 Personal history of other specified conditions: Secondary | ICD-10-CM

## 2024-05-25 DIAGNOSIS — M25552 Pain in left hip: Secondary | ICD-10-CM | POA: Diagnosis not present

## 2024-05-30 ENCOUNTER — Ambulatory Visit: Payer: Self-pay | Admitting: Neurology

## 2024-05-30 DIAGNOSIS — G478 Other sleep disorders: Secondary | ICD-10-CM

## 2024-05-30 DIAGNOSIS — G4733 Obstructive sleep apnea (adult) (pediatric): Secondary | ICD-10-CM

## 2024-05-30 NOTE — Procedures (Signed)
 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   ADDITIONAL INFORMATION:  The Epworth Sleepiness Scale endorsed at 6 /24 points (scores above or equal to 10 are suggestive of hypersomnolence). Height: 69 in Weight: 141 lb (BMI 20) Neck Size: 14 in  MEDICATIONS: Tylenol , Ventolin  HFA, Astelin , Uceris , Vitamin D3, DDAVP , Voltaren , Nexium , Pepcid, Xhance, Levsin  SL, Atrovent , Lialda , Robaxin , Toprol  XL, Mycostatin , Lyrica , Crestor , Azelastine  HCI  TECHNICAL DESCRIPTION: A registered sleep technologist ( RPSGT)  was in attendance for the duration of the recording.   Data collection, scoring, video monitoring, and reporting were performed in compliance with the AASM Manual for the Scoring of Sleep and Associated Events; (Hypopnea is scored based on the criteria listed in Section VIII D. 1b in the AASM Manual V2.6 using a 4% oxygen desaturation rule or Hypopnea is scored based on the criteria listed in Section VIII D. 1a in the AASM Manual V2.6 using 3% oxygen desaturation and /or arousal rule).   SLEEP CONTINUITY AND SLEEP ARCHITECTURE:  Lights-out was at 21:59: and lights-on at  04:51:, with  6.9 hours of recording time .  Total sleep time ( TST) was 229.5 minutes  and showed a  decreased sleep efficiency at 55.6%.  BODY POSITION:  TST was divided between the following sleep positions: 78.4% supine;  21.6% lateral.; right 38 minutes (17%), left 11 minutes (5%), and prone 00 minutes (0%).   Sleep latency was 21.0  minutes.  REM sleep  VOID - . Of the total sleep time, the percentage of stage N1 sleep was 43.4%, stage N2 sleep was 57%, stage N3 sleep was 0.0%, and REM sleep was 0.0%. Wake after sleep onset (WASO) time accounted for 162 minutes (!)  .   RESPIRATORY MONITORING:   Based on CMS criteria (using a 4% oxygen desaturation rule for scoring hypopneas), there were 174 apneas (171 obstructive; 2 central; 1 mixed), and 2 hypopneas.  Apnea index was 45.5. Hypopnea index was 0.5. The AHI ( apnea-hypopnea index)  was 46.0/h overall (51.0 supine, 0 non-supine; 0.0 REM, 0.0 supine REM).  There were 0 respiratory effort-related arousals (RERAs)   OXIMETRY: Oxyhemoglobin Saturation Nadir during sleep was at  74 % from a mean of 96%.  Total sleep time (TST)  hypoxemia (=<88%) was present for  7.6 minutes, or 3.3% of total sleep time.  LIMB MOVEMENTS: There were 21 periodic limb movements of sleep ,of which 6 (1.6/h) were associated with an arousal. AROUSAL: There were 31 arousals/hour.  Of these, 88 were identified as respiratory-related arousals (23 /h), 6 were PLM-related arousals (2 /h), and 51 were non-specific arousals (13 /h). There were 0 occurrences of Cheyne Stokes breathing.  Snoring was classified as between mild and loud,  depending on position. . EEG:  PSG EEG was of normal amplitude and frequency, with symmetric manifestation of sleep stages. EKG: The electrocardiogram documented NSR.  The average heart rate during sleep was 65 bpm.  The heart rate during sleep varied between a minimum of 53 and  a maximum of  84 bpm. AUDIO and VIDEO:  no dream enactment , no somniloquism.    IMPRESSION: 1) Sleep disordered breathing was present: In the form of Severe Obstructive Sleep Apnea with an AHI of 46/h , associated with a nadir of 02 at 74% . REM sleep void architecture of sleep.     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  3) Sleep efficiency was poor, and  most arousals were related to sleep disordered breathing. Total sleep time was reduced at 229.5 minutes.  Sleep efficiency was decreased at 55.6%.    RECOMMENDATIONS:  Immediate return for CPAP titration unless the patient prefers an autotitration device at home.  I have placed an order for a in lab CPAP titration and will recommend to use a sleep aid for the night of the test.     [name], [name]            General Information  Name: [name], [name] BMI: 20.82 Physician: [name], [name]  ID: [id number] Height: 69.0 in Technician: [name], RPSGT  Sex: Male Weight: 141.0 lb Record: [id number]  Age: 23 [09/03/1956] Date: 05/24/2024    Medical & Medication History    [name], [name] is a 68 y.o. male patient who is seen upon Dr [name] referral on 04/12/2024 for a sleep medicine consultation. . 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 He has used an apple watch that keeps alerting him , he is snoring, and he is more daytime- sleepy than he would have been 2 years ago. His sleep is more broken up. He sleeps usually 7 hours but its very restless, in the morning he goes sometimes to a recliner where he catches an extra hour of  nice refreshing REM sleep . Weight has been stabile, he may have lost 6 pounds in the last 2 years ( due to GI problems) certainly no gain.  Tylenol , Ventolin  HFA, Astelin , Uceris , Vitamin D3, DDAVP , Voltaren , Nexium , Pepcid, Xhance, Levsin  SL, Atrovent , Lialda , Robaxin , Toprol  XL, Mycostatin , Lyrica , Crestor , Azelastine  HCI   Sleep Disorder      Comments       Lights out: 09:59:14 PM Lights on: 04:51:48 AM   Time Total Supine Side Prone Upright  Recording (TRT) 6h 52.42m 5h 12.10m 1h 40.23m 0h 0.37m 0h 0.10m  Sleep (TST) 3h 49.63m 3h 0.68m 0h 49.78m 0h 0.10m 0h 0.58m   Latency N1 N2 N3 REM Onset Per. Slp. Eff.  Actual 0h 21.75m 1h 35.32m 0h 0.14m 0h 0.70m 0h 21.15m 3h 0.79m 55.64%    Stg Dur Wake N1 N2 N3 REM  Total 162.0 99.5 130.0 0.0 0.0  Supine 111.5 88.5 91.5 0.0 0.0  Side 50.5 11.0 38.5 0.0 0.0  Prone 0.0 0.0 0.0 0.0 0.0  Upright 0.0 0.0 0.0 0.0 0.0   Stg % Wake N1 N2 N3 REM  Total 41.4 43.4 56.6 0.0 0.0  Supine 28.5 38.6 39.9 0.0 0.0  Side 12.9 4.8 16.8 0.0 0.0  Prone 0.0 0.0 0.0 0.0 0.0  Upright 0.0 0.0 0.0 0.0 0.0     Apnea Summary Sub Supine Side Prone Upright  Total 174 Total 174 152 22 0 0    REM 0 0 0 0 0    NREM 174 152 22 0 0  Obs 171 REM 0 0 0  0 0    NREM 171 150 21 0 0  Mix 1 REM 0 0 0 0 0    NREM 1 1 0 0 0  Cen 2 REM 0 0 0 0 0    NREM 2 1 1  0 0   Rera Summary Sub Supine Side Prone Upright  Total 0 Total 0 0 0 0 0    REM 0 0 0 0 0    NREM 0 0 0 0 0   Hypopnea Summary Sub Supine Side Prone Upright  Total 10 Total 10 4 6  0 0    REM 0 0 0 0 0    NREM 10 4 6  0 0   4% Hypopnea Summary Sub Supine Side Prone Upright  Total (4%) 2 Total 2 1 1  0 0    REM 0 0 0 0 0    NREM 2 1 1  0 0     AHI Total Obs Mix Cen  48.10 Apnea 45.49 44.71 0.26 0.52   Hypopnea 2.61 -- -- --  46.01 Hypopnea (4%) 0.52 -- -- --    Total Supine Side Prone Upright  Position AHI 48.10 52.00 33.94 0.00 0.00  REM AHI 0.00   NREM AHI 48.10   Position RDI 48.10 52.00 33.94 0.00 0.00  REM RDI 0.00   NREM RDI 48.10    4% Hypopnea Total Supine Side Prone Upright  Position AHI (4%) 46.01 51.00 27.88 0.00 0.00  REM AHI (4%) 0.00   NREM AHI (4%) 46.01   Position RDI (4%) 46.01 51.00 27.88 0.00 0.00  REM RDI (4%) 0.00   NREM RDI (4%) 46.01    Desaturation Information Threshold: 2% <100% <90% <80% <70% <60% <50% <40%  Supine 239.0 43.0 1.0 0.0 0.0 0.0 0.0  Side 53.0 0.0 0.0 0.0 0.0 0.0 0.0  Prone 0.0 0.0 0.0 0.0 0.0 0.0 0.0  Upright 0.0 0.0 0.0 0.0 0.0 0.0 0.0  Total 292.0 43.0 1.0 0.0 0.0 0.0 0.0  Index 44.8 6.6 0.2 0.0 0.0 0.0 0.0   Threshold: 3% <100% <90% <80% <70% <60% <50% <40%  Supine 171.0 43.0 1.0 0.0 0.0 0.0 0.0  Side 31.0 0.0 0.0 0.0 0.0 0.0  0.0  Prone 0.0 0.0 0.0 0.0 0.0 0.0 0.0  Upright 0.0 0.0 0.0 0.0 0.0 0.0 0.0  Total 202.0 43.0 1.0 0.0 0.0 0.0 0.0  Index 31.0 6.6 0.2 0.0 0.0 0.0 0.0   Threshold: 4% <100% <90% <80% <70% <60% <50% <40%  Supine 139.0 43.0 1.0 0.0 0.0 0.0 0.0  Side 16.0 0.0 0.0 0.0 0.0 0.0 0.0  Prone 0.0 0.0 0.0 0.0 0.0 0.0 0.0  Upright 0.0 0.0 0.0 0.0 0.0 0.0 0.0  Total 155.0 43.0 1.0 0.0 0.0 0.0 0.0  Index 23.8 6.6 0.2 0.0 0.0 0.0 0.0   Threshold: 4% <100% <90% <80% <70% <60% <50% <40%  Supine 139 43 1 0 0 0 0  Side 16 0 0 0 0 0 0  Prone 0 0 0 0 0 0 0  Upright 0 0 0 0 0 0 0  Total 155 43 1 0 0 0 0   Awakening/Arousal Information # of Awakenings 57  Wake after sleep onset 162.76m  Wake after persistent sleep 49.41m   Arousal Assoc. Arousals Index  Apneas 87 22.7  Hypopneas 1 0.3  Leg Movements 9 2.4  Snore 0 0.0  PTT Arousals 0 0.0  Spontaneous 51 13.3  Total 148 38.7  Leg Movement Information PLMS LMs Index  Total LMs during PLMS 21 5.5  LMs w/ Microarousals 6 1.6   LM LMs Index  w/ Microarousal 3 0.8  w/ Awakening 0 0.0  w/ Resp Event 0 0.0  Spontaneous 12 3.1  Total 15 3.9     Desaturation threshold setting: 4% Minimum desaturation setting: 10 seconds SaO2 nadir: 69% The longest event was a 79 sec obstructive Apnea with a minimum SaO2 of 87%. The lowest SaO2 was 50% associated with a 31 sec obstructive Apnea. EKG Rates EKG Avg Max Min  Awake 69 87 55  Asleep 65 84

## 2024-05-30 NOTE — Addendum Note (Signed)
 Addended by: Elton Ham on: 05/30/2024 05:57 PM   Modules accepted: Orders

## 2024-06-02 ENCOUNTER — Ambulatory Visit

## 2024-06-02 VITALS — BP 114/65 | HR 67 | Temp 98.0°F | Resp 16 | Ht 69.0 in | Wt 143.0 lb

## 2024-06-02 DIAGNOSIS — K529 Noninfective gastroenteritis and colitis, unspecified: Secondary | ICD-10-CM

## 2024-06-02 DIAGNOSIS — K5150 Left sided colitis without complications: Secondary | ICD-10-CM | POA: Diagnosis not present

## 2024-06-02 MED ORDER — VEDOLIZUMAB 300 MG IV SOLR
300.0000 mg | Freq: Once | INTRAVENOUS | Status: AC
  Administered 2024-06-02: 300 mg via INTRAVENOUS
  Filled 2024-06-02: qty 5

## 2024-06-02 NOTE — Progress Notes (Signed)
 Diagnosis: Left - Sided colitis  Provider:  Phyllis Breeze MD  Procedure: IV Infusion  IV Type: Peripheral, IV Location: R Forearm  Entyvio  (Vedolizumab ), Dose: 300 mg  Infusion Start Time: 1411  Infusion Stop Time: 1443  Post Infusion IV Care: Peripheral IV Discontinued  Discharge: Condition: Good, Destination: Home . AVS Declined  Performed by:  Denny Mccree, RN

## 2024-06-03 ENCOUNTER — Ambulatory Visit: Admitting: Cardiology

## 2024-06-07 ENCOUNTER — Encounter (HOSPITAL_COMMUNITY): Payer: Self-pay | Admitting: Emergency Medicine

## 2024-06-07 ENCOUNTER — Emergency Department (HOSPITAL_COMMUNITY)
Admission: EM | Admit: 2024-06-07 | Discharge: 2024-06-08 | Disposition: A | Discharge status: Home / Self Care | Attending: Emergency Medicine | Admitting: Emergency Medicine

## 2024-06-07 DIAGNOSIS — N4289 Other specified disorders of prostate: Secondary | ICD-10-CM | POA: Diagnosis not present

## 2024-06-07 DIAGNOSIS — Z79899 Other long term (current) drug therapy: Secondary | ICD-10-CM | POA: Diagnosis not present

## 2024-06-07 DIAGNOSIS — N4232 Atypical small acinar proliferation of prostate: Secondary | ICD-10-CM | POA: Diagnosis not present

## 2024-06-07 DIAGNOSIS — R972 Elevated prostate specific antigen [PSA]: Secondary | ICD-10-CM | POA: Diagnosis not present

## 2024-06-07 DIAGNOSIS — R339 Retention of urine, unspecified: Secondary | ICD-10-CM | POA: Diagnosis not present

## 2024-06-07 DIAGNOSIS — N411 Chronic prostatitis: Secondary | ICD-10-CM | POA: Diagnosis not present

## 2024-06-07 DIAGNOSIS — I4891 Unspecified atrial fibrillation: Secondary | ICD-10-CM | POA: Diagnosis not present

## 2024-06-07 DIAGNOSIS — G4733 Obstructive sleep apnea (adult) (pediatric): Secondary | ICD-10-CM | POA: Diagnosis not present

## 2024-06-07 HISTORY — PX: PROSTATE BIOPSY: SHX241

## 2024-06-07 NOTE — Discharge Instructions (Signed)
 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 Leave foley in place for now. Will need to follow-up with urology later this week for removal and void trial. Return here if any acute changes-- foley stops draining/clogged, lots of bleeding, fever, etc.

## 2024-06-07 NOTE — ED Triage Notes (Signed)
 PT reports he had prostate biopsy today and was unable to void. He was straight cathed and sent home and attempted to straight cath himself when unable to void at home. He got no urine return when he attempted. Feels that he needs to void now and cannot.

## 2024-06-07 NOTE — ED Provider Notes (Signed)
 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   The history is provided by the patient and medical records.   68 year old male with history of elevated PSA that underwent prostate biopsy earlier today with Dr. Nicholaus, presenting to the ED with urinary retention.  States he took his Flomax tonight and still was not able to urinate.  He tried to self cath at home with a 14 [place] Foley, unable to get any urine return.  Unsure if it clotted with blood or if kinked.  Called urology PTA and recommended foley placement and they would follow-up with him later this week for removal and void trial in office.  Foley was placed upon arrival here and he reports immediate relief.  Prior to Admission medications   Medication Sig Start Date End Date Taking? Authorizing Provider  acetaminophen  (TYLENOL ) 500 MG tablet Take 500-1,000 mg by mouth as needed.    [provider]  albuterol  (VENTOLIN  HFA) 108 (90 Base) MCG/ACT inhaler Inhale 2 puffs into the lungs every 4 (four) hours as needed. 03/04/22     ALPRAZolam  (XANAX ) 0.5 MG tablet Take 1 tablet (0.5 mg total) by mouth at bedtime as needed for anxiety. 04/12/24   Dohmeier, Dedra, MD  azelastine  (ASTELIN ) 0.1 % nasal spray Place 1 spray into both nostrils 2 (two) times daily. 12/25/22   Jesus Oliphant, MD  Budesonide  ER (UCERIS ) 9 MG TB24 Take 1 tablet (9 mg total) by mouth daily. 04/05/24   Pyrtle, Gordy HERO, MD  celecoxib  (CELEBREX ) 100 MG capsule Take 1 capsule (100 mg total) by mouth 2 (two) times daily. Patient not taking: Reported on 04/05/2024 07/30/23     Cholecalciferol (VITAMIN D3) 125 MCG (5000 UT) TABS Take 5,000 tablets by mouth. Twice weekly.    [provider]  COMIRNATY syringe  09/06/22   [provider]  desmopressin  (DDAVP ) 0.2 MG tablet Take 1 tablet (0.2 mg  total) by mouth 2 (two) times daily as directed Patient taking differently: Take 0.2 mg by mouth daily. 12/09/22     diclofenac  Sodium (VOLTAREN ) 1 % GEL Apply 2 g topically to the affected area(s) 4 (four) times daily. 07/20/23     esomeprazole  (NEXIUM ) 40 MG capsule Take 1 capsule (40 mg total) by mouth daily at 12 noon. 03/11/24   Pyrtle, Gordy HERO, MD  famotidine (PEPCID) 20 MG tablet Take 20 mg by mouth daily as needed.    [provider]  Fluticasone  Propionate DARRAL) 93 MCG/ACT EXHU Place 2 sprays into the nose 2 times daily at 12 noon and 4 pm.    [provider]  fluticasone -salmeterol (ADVAIR ) 100-50 MCG/ACT AEPB Inhale 1 puff into the lungs daily. Patient not taking: Reported on 05/23/2024 08/14/23   Avva, Ravisankar, MD  hyoscyamine  (LEVSIN  SL) 0.125 MG SL tablet Take 1 by mouth every 6-8 hours as needed 12/14/23   Pyrtle, Gordy HERO, MD  ipratropium (ATROVENT ) 0.06 % nasal spray Place 2 sprays into both nostrils 3 (three) times daily. 07/29/23     mesalamine  (LIALDA ) 1.2 g EC tablet Take 2 tablets (2.4 g total) by mouth in the morning and at bedtime. 03/30/24   Craig Alan SAUNDERS, PA-C  methocarbamol  (ROBAXIN ) 500 MG tablet Take 1 tablet (500 mg total) by mouth every 8 (eight) hours. Patient not taking: Reported on 05/23/2024  08/26/22     metoprolol  tartrate (LOPRESSOR ) 25 MG tablet Take 0.5 tablets (12.5 mg total) by mouth 2 (two) times daily as needed. 05/23/24 08/21/24  Ladona Heinz, MD  nystatin  cream (MYCOSTATIN ) Apply 2-3 times to affected areas as directed External 30 days 04/08/21     rosuvastatin  (CRESTOR ) 20 MG tablet Take 1 tablet (20 mg total) by mouth daily. 07/02/23     Zolpidem  Tartrate 1.75 MG SUBL Place 1 tablet under the tongue once a night. Patient not taking: Reported on 05/23/2024 10/16/22       Allergies: Amoxicillin-pot clavulanate, Cephalexin, Doxycycline, Levofloxacin , Metronidazole, and Prednisone     Review of Systems  Genitourinary:  Positive for difficulty  urinating.  All other systems reviewed and are negative.   Updated Vital Signs BP (!) 159/92   Pulse 95   SpO2 100%   Physical Exam Vitals and nursing note reviewed.  Constitutional:      Appearance: He is well-developed.  HENT:     Head: Normocephalic and atraumatic.   Eyes:     Conjunctiva/sclera: Conjunctivae normal.     Pupils: Pupils are equal, round, and reactive to light.    Cardiovascular:     Rate and Rhythm: Normal rate and regular rhythm.     Heart sounds: Normal heart sounds.  Pulmonary:     Effort: Pulmonary effort is normal.     Breath sounds: Normal breath sounds.  Abdominal:     General: Bowel sounds are normal.     Palpations: Abdomen is soft.     Comments: Soft, non-tender  Genitourinary:    Comments: Foley in placed, 1100cc yellow urine in foley, minimal blood  Musculoskeletal:        General: Normal range of motion.     Cervical back: Normal range of motion.   Skin:    General: Skin is warm and dry.   Neurological:     Mental Status: He is alert and oriented to person, place, and time.     (all labs ordered are listed, but only abnormal results are displayed) Labs Reviewed - No data to display  EKG: None  Radiology: No results found.   Procedures   Medications Ordered in the ED - No data to display                                  Medical Decision Making  68 year old male presenting to the ED with urinary retention.  Had prostate biopsy earlier today.  Attempted to take Flomax and self cath this evening without relief.  Discussed with on-call urology and recommended ER visit with Foley placement.  Foley was placed prior to my evaluation, immediate relief of symptoms.  Currently pain free.  1100cc present in foley bag.  Abdomen soft, non-tender.  No significant bleeding.  Will leave foley in place, urology is arranging him an appt for later in the week for removal with void trial in office.  Can return here for any new/acute  changes.  Final diagnoses:  Urinary retention    ED Discharge Orders     None          Jarold Olam HERO, PA-C 06/08/24 0008    Bari Charmaine FALCON, MD 06/10/24 [phone]

## 2024-06-07 NOTE — ED Notes (Signed)
 Foley catheter coude 16Fr placed without issue, pt expresses relief following, draining without issue.

## 2024-06-10 DIAGNOSIS — R339 Retention of urine, unspecified: Secondary | ICD-10-CM | POA: Diagnosis not present

## 2024-06-10 DIAGNOSIS — G4733 Obstructive sleep apnea (adult) (pediatric): Secondary | ICD-10-CM | POA: Diagnosis not present

## 2024-06-11 ENCOUNTER — Other Ambulatory Visit: Payer: Self-pay

## 2024-06-11 ENCOUNTER — Emergency Department (HOSPITAL_COMMUNITY)
Admission: EM | Admit: 2024-06-11 | Discharge: 2024-06-11 | Disposition: A | Discharge status: Home / Self Care | Attending: Emergency Medicine | Admitting: Emergency Medicine

## 2024-06-11 ENCOUNTER — Encounter (HOSPITAL_COMMUNITY): Payer: Self-pay

## 2024-06-11 DIAGNOSIS — J45909 Unspecified asthma, uncomplicated: Secondary | ICD-10-CM | POA: Diagnosis not present

## 2024-06-11 DIAGNOSIS — K6289 Other specified diseases of anus and rectum: Secondary | ICD-10-CM | POA: Insufficient documentation

## 2024-06-11 DIAGNOSIS — E119 Type 2 diabetes mellitus without complications: Secondary | ICD-10-CM | POA: Insufficient documentation

## 2024-06-11 DIAGNOSIS — R339 Retention of urine, unspecified: Secondary | ICD-10-CM | POA: Diagnosis not present

## 2024-06-11 LAB — URINALYSIS, W/ REFLEX TO CULTURE (INFECTION SUSPECTED)
Bilirubin Urine: NEGATIVE
Glucose, UA: NEGATIVE mg/dL
Ketones, ur: NEGATIVE mg/dL
Nitrite: NEGATIVE
Protein, ur: NEGATIVE mg/dL
Specific Gravity, Urine: 1.011 (ref 1.005–1.030)
pH: 6 (ref 5.0–8.0)

## 2024-06-11 MED ORDER — SULFAMETHOXAZOLE-TRIMETHOPRIM 800-160 MG PO TABS: 1.0000 | ORAL_TABLET | Freq: Two times a day (BID) | ORAL | 0 refills | Status: AC

## 2024-06-11 MED ORDER — TAMSULOSIN HCL 0.4 MG PO CAPS: 0.4000 mg | ORAL_CAPSULE | Freq: Every day | ORAL | 0 refills | Status: AC

## 2024-06-11 NOTE — Discharge Instructions (Signed)
 Take the antibiotics as discussed.  Continue taking Flomax.  Follow-up with your urologist on Monday.  Return to the emergency room if you have any worsening symptoms such as fevers, abdominal pain, vomiting, decreased urine output, or other worsening symptoms

## 2024-06-11 NOTE — ED Notes (Signed)
 Declined last set vs

## 2024-06-11 NOTE — ED Triage Notes (Signed)
 PT arrives via POV. PT reports having a prostate biopsy on Tuesday. He reports he had to have a foley catheter later that night due to urinary retention. Catheter was removed yesterday. He states since last night he is only had a few occurences of dribbling. PT also reports new rectal pain today. Pt arrives AxOx4.

## 2024-06-11 NOTE — ED Notes (Signed)
 Patient is resting comfortably.

## 2024-06-11 NOTE — ED Notes (Signed)
 Switched catheter to leg bag

## 2024-06-11 NOTE — ED Notes (Signed)
Pt is refusing EKG

## 2024-06-11 NOTE — ED Provider Notes (Signed)
 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   Patient is a 68 year old male who presents with difficulty urinating.  He had a prostate biopsy this past Tuesday.  He originally had developed some urinary retention and came to the ED and had a Foley catheter placed.  He went to his urologist yesterday and had it removed.  He was able to urinate in the office following that.  During the night he has had difficulty urinating again.  He has increased lower abdominal pain and rectal pain.  He says he has been having normal bowel movements.  No fevers.  No nausea or vomiting.  He has been taking Flomax but only has 1 tablet left.       Prior to Admission medications   Medication Sig Start Date End Date Taking? Authorizing Provider  sulfamethoxazole -trimethoprim  (BACTRIM  DS) 800-160 MG tablet Take 1 tablet by mouth 2 (two) times daily for 7 days. 06/11/24 06/18/24 Yes Lenor Hollering, MD  tamsulosin (FLOMAX) 0.4 MG CAPS capsule Take 1 capsule (0.4 mg total) by mouth daily. 06/11/24  Yes Lenor Hollering, MD  acetaminophen  (TYLENOL ) 500 MG tablet Take 500-1,000 mg by mouth as needed.    [provider]  albuterol  (VENTOLIN  HFA) 108 (90 Base) MCG/ACT inhaler Inhale 2 puffs into the lungs every 4 (four) hours as needed. 03/04/22     ALPRAZolam  (XANAX ) 0.5 MG tablet Take 1 tablet (0.5 mg total) by mouth at bedtime as needed for anxiety. 04/12/24   Dohmeier, Dedra, MD  azelastine  (ASTELIN ) 0.1 % nasal spray Place 1 spray into both nostrils 2 (two) times daily. 12/25/22   Jesus Oliphant, MD  Budesonide  ER (UCERIS ) 9 MG TB24 Take 1 tablet (9 mg total) by mouth daily. 04/05/24   Pyrtle, Gordy HERO, MD  celecoxib  (CELEBREX ) 100 MG capsule Take 1 capsule (100 mg total) by mouth 2 (two) times daily. Patient not taking: Reported on  04/05/2024 07/30/23     Cholecalciferol (VITAMIN D3) 125 MCG (5000 UT) TABS Take 5,000 tablets by mouth. Twice weekly.    [provider]  COMIRNATY syringe  09/06/22   [provider]  desmopressin  (DDAVP ) 0.2 MG tablet Take 1 tablet (0.2 mg total) by mouth 2 (two) times daily as directed Patient taking differently: Take 0.2 mg by mouth daily. 12/09/22     diclofenac  Sodium (VOLTAREN ) 1 % GEL Apply 2 g topically to the affected area(s) 4 (four) times daily. 07/20/23     esomeprazole  (NEXIUM ) 40 MG capsule Take 1 capsule (40 mg total) by mouth daily at 12 noon. 03/11/24   Pyrtle, Gordy HERO, MD  famotidine (PEPCID) 20 MG tablet Take 20 mg by mouth daily as needed.    [provider]  Fluticasone  Propionate (XHANCE) 93 MCG/ACT EXHU Place 2 sprays into the nose 2 times daily at 12 noon and 4 pm.    [provider]  fluticasone -salmeterol (ADVAIR ) 100-50 MCG/ACT AEPB Inhale 1 puff into the lungs daily. Patient not taking: Reported on 05/23/2024 08/14/23   Avva, Ravisankar, MD  hyoscyamine  (LEVSIN  SL) 0.125 MG SL tablet Take 1 by mouth every 6-8 hours as needed 12/14/23   Pyrtle, Gordy HERO, MD  ipratropium (ATROVENT ) 0.06 % nasal spray Place 2 sprays into both nostrils 3 (three) times daily. 07/29/23  mesalamine  (LIALDA ) 1.2 g EC tablet Take 2 tablets (2.4 g total) by mouth in the morning and at bedtime. 03/30/24   Craig Alan SAUNDERS, PA-C  methocarbamol  (ROBAXIN ) 500 MG tablet Take 1 tablet (500 mg total) by mouth every 8 (eight) hours. Patient not taking: Reported on 05/23/2024 08/26/22     metoprolol  tartrate (LOPRESSOR ) 25 MG tablet Take 0.5 tablets (12.5 mg total) by mouth 2 (two) times daily as needed. 05/23/24 08/21/24  Ladona Heinz, MD  nystatin  cream (MYCOSTATIN ) Apply 2-3 times to affected areas as directed External 30 days 04/08/21     rosuvastatin  (CRESTOR ) 20 MG tablet Take 1 tablet (20 mg total) by mouth daily. 07/02/23     Zolpidem  Tartrate 1.75 MG SUBL Place 1 tablet under the  tongue once a night. Patient not taking: Reported on 05/23/2024 10/16/22       Allergies: Amoxicillin-pot clavulanate, Cephalexin, Doxycycline, Levofloxacin , Metronidazole, and Prednisone     Review of Systems  Constitutional:  Negative for chills, diaphoresis, fatigue and fever.  Eyes: Negative.   Respiratory:  Negative for shortness of breath.   Cardiovascular:  Negative for leg swelling.  Gastrointestinal:  Positive for abdominal pain and rectal pain. Negative for nausea and vomiting.  Genitourinary:  Positive for decreased urine volume, difficulty urinating, frequency and urgency.  Musculoskeletal:  Negative for back pain.  Neurological:  Negative for dizziness and weakness.    Updated Vital Signs BP (!) 152/85 (BP Location: Right Arm)   Pulse 73   Temp 98.2 F (36.8 C) (Oral)   Resp 18   Ht 5' 9 (1.753 m)   Wt 65.8 kg   SpO2 100%   BMI 21.41 kg/m   Physical Exam Constitutional:      Appearance: He is well-developed.     Comments: Appears uncomfortable  HENT:     Head: Normocephalic and atraumatic.   Eyes:     Pupils: Pupils are equal, round, and reactive to light.    Cardiovascular:     Rate and Rhythm: Normal rate and regular rhythm.     Heart sounds: Normal heart sounds.  Pulmonary:     Effort: Pulmonary effort is normal. No respiratory distress.     Breath sounds: Normal breath sounds. No wheezing or rales.  Chest:     Chest wall: No tenderness.  Abdominal:     General: Bowel sounds are normal.     Palpations: Abdomen is soft.     Tenderness: There is no abdominal tenderness. There is no guarding or rebound.   Musculoskeletal:        General: Normal range of motion.     Cervical back: Normal range of motion and neck supple.  Lymphadenopathy:     Cervical: No cervical adenopathy.   Skin:    General: Skin is warm and dry.     Findings: No rash.   Neurological:     Mental Status: He is alert and oriented to person, place, and time.     (all labs  ordered are listed, but only abnormal results are displayed) Labs Reviewed  URINALYSIS, W/ REFLEX TO CULTURE (INFECTION SUSPECTED) - Abnormal; Notable for the following components:      Result Value   Hgb urine dipstick MODERATE (*)    Leukocytes,Ua MODERATE (*)    Bacteria, UA RARE (*)    All other components within normal limits  URINE CULTURE    EKG: None  Radiology: No results found.   Procedures   Medications Ordered in the ED -  No data to display                                  Medical Decision Making Amount and/or Complexity of Data Reviewed Labs: ordered.  Risk Prescription drug management.   This patient presents to the ED for concern of urinary retention, this involves an extensive number of treatment options, and is a complaint that carries with it a high risk of complications and morbidity.  I considered the following differential and admission for this acute, potentially life threatening condition.  The differential diagnosis includes urinary obstruction, infection, prostatitis, constipation, kidney stone, mass  MDM:    Patient presents with urinary retention after recent prostate biopsy.  He had his catheter removed yesterday and was urinating okay during the day but during the night developed urinary retention again.  On arrival, he had some intense rectal pain as well but once the Foley catheter was placed, his symptoms resolved.  He denies any current pain.  He does not have a fever.  His urine was sent for urinalysis and is concerning for infection.  It was sent for culture.  He is not febrile and is otherwise well-appearing.  Will start antibiotics.  He states the only antibiotic that he can tolerate is Bactrim .  Will start this.  Will also continue him on Flomax.  He says he only had 1 tablet left and we will give him a refill on this.  Given that he is currently not having other symptoms, do not feel that he needs imaging of his abdomen or bladder.  He will  follow-up with his urologist on Monday.  Return precautions were given.  He did feel like his heart rate went up during the night to the 120s.  He took his metoprolol  and it resolved.  His Apple Watch read sinus rhythm and did not appear to be an arrhythmia although he has a prior history of atrial fibrillation.  Heart sounds are regular.  EKG was ordered but patient does not feel like he needs this.  He says his Apple Watch is indicating that he is in a sinus rhythm.  (Labs, imaging, consults)  Labs: I Ordered, and personally interpreted labs.  The pertinent results include: Urinalysis is concerning for infection  Imaging Studies ordered: I ordered imaging studies including none I independently visualized and interpreted imaging. I agree with the radiologist interpretation  Additional history obtained from chart review.  External records from outside source obtained and reviewed including prior notes  Cardiac Monitoring: The patient was not maintained on a cardiac monitor.  If on the cardiac monitor, I personally viewed and interpreted the cardiac monitored which showed an underlying rhythm of:    Reevaluation: After the interventions noted above, I reevaluated the patient and found that they have :improved  Social Determinants of Health:    Disposition: Discharged to home  Co morbidities that complicate the patient evaluation  Past Medical History:  Diagnosis Date   Arthritis 03/2020   R hip   Asthma    Atherosclerotic heart disease of native coronary artery without angina pectoris    Atrial fibrillation (HCC)    DDD (degenerative disc disease), lumbar    Diabetes insipidus (HCC)    Elevated PSA    GERD (gastroesophageal reflux disease)    History of adenomatous polyp of colon    HLD (hyperlipidemia)    Hypercholesteremia    Insomnia    Internal  hemorrhoids    Intracranial arachnoid cyst    Monoallelic mutation of AVP gene    Other chronic sinusitis    Other fatigue     PSA elevation    Snoring    Ulcerative colitis    Ulcerative colitis (HCC)      Medicines Meds ordered this encounter  Medications   sulfamethoxazole -trimethoprim  (BACTRIM  DS) 800-160 MG tablet    Sig: Take 1 tablet by mouth 2 (two) times daily for 7 days.    Dispense:  14 tablet    Refill:  0   tamsulosin (FLOMAX) 0.4 MG CAPS capsule    Sig: Take 1 capsule (0.4 mg total) by mouth daily.    Dispense:  20 capsule    Refill:  0    I have reviewed the patients home medicines and have made adjustments as needed  Problem List / ED Course: Problem List Items Addressed This Visit   None Visit Diagnoses       Urinary retention    -  Primary                Final diagnoses:  Urinary retention    ED Discharge Orders          Ordered    sulfamethoxazole -trimethoprim  (BACTRIM  DS) 800-160 MG tablet  2 times daily        06/11/24 0923    tamsulosin (FLOMAX) 0.4 MG CAPS capsule  Daily        06/11/24 0923               Lenor Hollering, MD 06/11/24 [phone]

## 2024-06-13 ENCOUNTER — Telehealth: Payer: Self-pay | Admitting: Cardiology

## 2024-06-13 DIAGNOSIS — R002 Palpitations: Secondary | ICD-10-CM

## 2024-06-13 DIAGNOSIS — Z8679 Personal history of other diseases of the circulatory system: Secondary | ICD-10-CM

## 2024-06-13 DIAGNOSIS — I491 Atrial premature depolarization: Secondary | ICD-10-CM

## 2024-06-13 DIAGNOSIS — G4733 Obstructive sleep apnea (adult) (pediatric): Secondary | ICD-10-CM

## 2024-06-13 LAB — URINE CULTURE: Culture: >=100000 — AB

## 2024-06-13 NOTE — Telephone Encounter (Signed)
 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   He called me and texted me over the telephone regarding elevated heart rate in the 90s and also frequent episodes of palpitations.  Although I have not uploaded the PDF that he texted me, he has had frequent PACs but no atrial fibrillation.  Recently he has also had prostate biopsy and since then had urinary retention needing emergency room visit x 2 for placement of Foley catheter due to retention.  On his last office visit due to concerns of bradycardia with heart rate dropping down to 40s at night, I had advised him to wean himself off of beta-blocker therapy.  But now his heart rate has consistently remained high with ongoing pelvic discomfort, urinary retention, hence advised him to restart back metoprolol  to tartrate 25 mg p.o. twice daily and also to consider PDE 5 inhibitor therapy with either Cialis 2.5 to 5 mg daily or sildenafil 50 mg 1/2 tablet twice daily for urinary retention.  He will discuss this further with his urologist.  Will set him up to see me back in the office in 2 to 3 weeks for follow-up and evaluation of palpitations.  In the interim he has also been diagnosed with severe obstructive sleep apnea with severe hypoxemia.  He has started using CPAP 3 days ago, still having some difficulty with getting used to the equipment but I have encouraged him to stay compliant.

## 2024-06-14 ENCOUNTER — Encounter: Payer: Self-pay | Admitting: Neurology

## 2024-06-14 ENCOUNTER — Telehealth (HOSPITAL_BASED_OUTPATIENT_CLINIC_OR_DEPARTMENT_OTHER): Payer: Self-pay

## 2024-06-14 NOTE — Progress Notes (Signed)
 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 [place], [place] [address]    Special Requests   Final    NONE Reflexed from [id number] Performed at [facility], [address], [place], [place] [address]    Culture (A)  Final    >=100,000 COLONIES/mL ESCHERICHIA COLI Confirmed Extended Spectrum Beta-Lactamase Producer (ESBL).  In bloodstream infections from ESBL organisms, carbapenems are preferred over piperacillin/tazobactam. They are shown to have a lower risk of mortality.    Report Status 06/13/2024 FINAL  Final   Organism ID, Bacteria ESCHERICHIA COLI (A)  Final      Susceptibility   Escherichia coli - MIC*    AMPICILLIN >=32 RESISTANT Resistant     CEFAZOLIN >=64 RESISTANT Resistant     CEFEPIME 16 RESISTANT Resistant     CEFTRIAXONE >=64 RESISTANT Resistant     CIPROFLOXACIN >=4 RESISTANT Resistant     GENTAMICIN >=16 RESISTANT Resistant     IMIPENEM <=0.25 SENSITIVE Sensitive     NITROFURANTOIN 32 SENSITIVE Sensitive     TRIMETH /SULFA  >=320 RESISTANT Resistant     AMPICILLIN/SULBACTAM >=32 RESISTANT Resistant     PIP/TAZO 16 SENSITIVE Sensitive ug/mL    * >=100,000 COLONIES/mL ESCHERICHIA COLI    [x]  Treated with Bactrim , organism resistant to prescribed antimicrobial  New antibiotic prescription: Fosfomycin 3g po x 1 dose  ED Provider: Medford Sakai, MD  Thank you for allowing pharmacy to be a part of this patient's care.  Almarie Lunger, PharmD, BCPS, BCIDP Infectious  Diseases Clinical Pharmacist 06/14/2024 8:52 AM   **Pharmacist phone directory can now be found on [email or web address] ([email or web address]).  Listed under [facility].

## 2024-06-14 NOTE — Telephone Encounter (Signed)
 Post ED Visit - Positive Culture Follow-up: Successful Patient Follow-Up  Culture assessed and recommendations reviewed by:  []  Rankin Dee, Pharm.D. []  Venetia Gully, Pharm.D., BCPS AQ-ID []  Garrel Crews, Pharm.D., BCPS [x]  Almarie Lunger, Pharm.D., BCPS []  [place], [place].D., BCPS, AAHIVP []  Rosaline Bihari, Pharm.D., BCPS, AAHIVP []  Vernell Meier, PharmD, BCPS []  Latanya Hint, PharmD, BCPS []  Donald Medley, PharmD, BCPS []  Rocky Bold, PharmD  Positive urine culture  []  Patient discharged without antimicrobial prescription and treatment is now indicated [x]  Organism is resistant to prescribed ED discharge antimicrobial []  Patient with positive blood cultures  Changes discussed with ED provider: Medford Sakai, MD New antibiotic prescription Fosfomycin 3g po x 1 dose.  Called to [facility] on [address]   Spoke with pt, pt plans to speak with his urologist.   Contacted patient, date 06/14/24, time 10:05 am   Ruth Camelia Elbe 06/14/2024, 10:07 AM

## 2024-06-14 NOTE — Telephone Encounter (Signed)
 I spoke with patient and scheduled him to see Dr Ladona on 7/29 at 2:40.  Patient reports he is doing well today

## 2024-06-15 ENCOUNTER — Telehealth: Payer: Self-pay | Admitting: Neurology

## 2024-06-15 DIAGNOSIS — R339 Retention of urine, unspecified: Secondary | ICD-10-CM | POA: Diagnosis not present

## 2024-06-15 NOTE — Telephone Encounter (Signed)
 Pt was scheduled for his initial CPAP on (08/24/24) Pt was informed to bring machine and power cord to the appointment.   DME  in pt's SnapShot and between dates are 7/29-9/27 .

## 2024-06-20 DIAGNOSIS — G4733 Obstructive sleep apnea (adult) (pediatric): Secondary | ICD-10-CM | POA: Diagnosis not present

## 2024-06-22 ENCOUNTER — Telehealth: Payer: Self-pay | Admitting: Neurology

## 2024-06-22 NOTE — Telephone Encounter (Signed)
 Spoke w/Pt regarding initial CPAP visit. Pt is requesting this visit and all visits to be with Dr. Chalice only. Offered Aug 21 at 8:30 am - Pt accepted appt.

## 2024-06-22 NOTE — Telephone Encounter (Signed)
 Pt called to in regards to getting initial CPAP appointment MD  not Np the patient states he would like to see Dr. Chalice for all appointments

## 2024-06-27 ENCOUNTER — Other Ambulatory Visit: Payer: Self-pay | Admitting: Registered Nurse

## 2024-06-27 DIAGNOSIS — R339 Retention of urine, unspecified: Secondary | ICD-10-CM

## 2024-06-27 DIAGNOSIS — R82998 Other abnormal findings in urine: Secondary | ICD-10-CM | POA: Diagnosis not present

## 2024-06-27 DIAGNOSIS — E785 Hyperlipidemia, unspecified: Secondary | ICD-10-CM | POA: Diagnosis not present

## 2024-06-27 DIAGNOSIS — R600 Localized edema: Secondary | ICD-10-CM

## 2024-06-27 DIAGNOSIS — E232 Diabetes insipidus: Secondary | ICD-10-CM | POA: Diagnosis not present

## 2024-06-28 DIAGNOSIS — M7062 Trochanteric bursitis, left hip: Secondary | ICD-10-CM | POA: Diagnosis not present

## 2024-06-29 ENCOUNTER — Ambulatory Visit
Admission: RE | Admit: 2024-06-29 | Discharge: 2024-06-29 | Disposition: A | Discharge status: Home / Self Care | Source: Ambulatory Visit | Attending: Registered Nurse | Admitting: Registered Nurse

## 2024-06-29 DIAGNOSIS — R600 Localized edema: Secondary | ICD-10-CM

## 2024-06-29 DIAGNOSIS — R339 Retention of urine, unspecified: Secondary | ICD-10-CM

## 2024-06-29 DIAGNOSIS — N400 Enlarged prostate without lower urinary tract symptoms: Secondary | ICD-10-CM | POA: Diagnosis not present

## 2024-06-29 MED ORDER — IOPAMIDOL (ISOVUE-300) INJECTION 61%
100.0000 mL | Freq: Once | INTRAVENOUS | Status: AC | PRN
  Administered 2024-06-29: 100 mL via INTRAVENOUS

## 2024-06-30 ENCOUNTER — Ambulatory Visit

## 2024-06-30 VITALS — BP 122/64 | HR 51 | Temp 97.7°F | Resp 16 | Ht 69.0 in | Wt 147.8 lb

## 2024-06-30 DIAGNOSIS — K529 Noninfective gastroenteritis and colitis, unspecified: Secondary | ICD-10-CM | POA: Diagnosis not present

## 2024-06-30 DIAGNOSIS — K5150 Left sided colitis without complications: Secondary | ICD-10-CM

## 2024-06-30 DIAGNOSIS — R399 Unspecified symptoms and signs involving the genitourinary system: Secondary | ICD-10-CM | POA: Diagnosis not present

## 2024-06-30 MED ORDER — VEDOLIZUMAB 300 MG IV SOLR
300.0000 mg | Freq: Once | INTRAVENOUS | Status: AC
  Administered 2024-06-30: 300 mg via INTRAVENOUS
  Filled 2024-06-30: qty 5

## 2024-06-30 NOTE — Progress Notes (Signed)
 Diagnosis: Left sided colitis  Provider:  Lonna Coder MD  Procedure: IV Infusion  IV Type: Peripheral, IV Location: L Antecubital  Entyvio  (Vedolizumab ), Dose: 300 mg  Infusion Start Time: 1418  Infusion Stop Time: 1500  Post Infusion IV Care: Peripheral IV Discontinued  Discharge: Condition: Good, Destination: Home . AVS Declined  Performed by:  Donny Childes, RN

## 2024-07-06 ENCOUNTER — Encounter: Payer: Self-pay | Admitting: Cardiology

## 2024-07-06 NOTE — Telephone Encounter (Signed)
 FYI

## 2024-07-08 ENCOUNTER — Encounter: Payer: Self-pay | Admitting: Internal Medicine

## 2024-07-08 NOTE — Telephone Encounter (Signed)
 Let's do non-live Zio for 2 weeks. Indication palpitations and bradycardia

## 2024-07-10 DIAGNOSIS — G4733 Obstructive sleep apnea (adult) (pediatric): Secondary | ICD-10-CM | POA: Diagnosis not present

## 2024-07-11 ENCOUNTER — Encounter: Payer: Self-pay | Admitting: Cardiology

## 2024-07-11 NOTE — Telephone Encounter (Signed)
Message routed to Dr.Chesterfield .

## 2024-07-12 ENCOUNTER — Ambulatory Visit: Attending: Rheumatology | Admitting: Rheumatology

## 2024-07-12 ENCOUNTER — Telehealth: Payer: Self-pay | Admitting: Internal Medicine

## 2024-07-12 ENCOUNTER — Encounter: Payer: Self-pay | Admitting: Rheumatology

## 2024-07-12 ENCOUNTER — Ambulatory Visit: Admitting: Cardiology

## 2024-07-12 VITALS — BP 137/69 | HR 67 | Resp 14 | Ht 69.0 in | Wt 144.0 lb

## 2024-07-12 DIAGNOSIS — K51818 Other ulcerative colitis with other complication: Secondary | ICD-10-CM

## 2024-07-12 DIAGNOSIS — Z87440 Personal history of urinary (tract) infections: Secondary | ICD-10-CM

## 2024-07-12 DIAGNOSIS — M51369 Other intervertebral disc degeneration, lumbar region without mention of lumbar back pain or lower extremity pain: Secondary | ICD-10-CM

## 2024-07-12 DIAGNOSIS — M7062 Trochanteric bursitis, left hip: Secondary | ICD-10-CM

## 2024-07-12 DIAGNOSIS — G478 Other sleep disorders: Secondary | ICD-10-CM

## 2024-07-12 DIAGNOSIS — M25452 Effusion, left hip: Secondary | ICD-10-CM | POA: Diagnosis not present

## 2024-07-12 DIAGNOSIS — M160 Bilateral primary osteoarthritis of hip: Secondary | ICD-10-CM

## 2024-07-12 DIAGNOSIS — M25552 Pain in left hip: Secondary | ICD-10-CM

## 2024-07-12 DIAGNOSIS — R972 Elevated prostate specific antigen [PSA]: Secondary | ICD-10-CM

## 2024-07-12 DIAGNOSIS — M5030 Other cervical disc degeneration, unspecified cervical region: Secondary | ICD-10-CM

## 2024-07-12 DIAGNOSIS — J310 Chronic rhinitis: Secondary | ICD-10-CM

## 2024-07-12 DIAGNOSIS — E7800 Pure hypercholesterolemia, unspecified: Secondary | ICD-10-CM

## 2024-07-12 NOTE — Progress Notes (Signed)
 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  He returns today after his last visit in 2019 when he was seen for left peroneal tendinitis.  He states those symptoms completely resolved.  He had a flare of ulcerative colitis in December 2023 which was treated with increasing the dose of mesalamine  and the symptoms improved.  He tried budesonide  but it caused insomnia.  He has been having intermittent flares and started Entyvio  on May 19, 2024.  He has known history of osteoarthritis of the hip joints for many years.  He has been followed by Dr. Heide at [facility].  He states he has had left hip joint injection about 2 months ago by Dr. Gifford which helped some.  He continued to have some discomfort over the lateral aspect of his left hip for which he has had trochanteric bursa injection x 5 since May 2024.  He states his trochanteric bursa injections any initially were helpful but for the last 2 injections were not helpful.  The last injection was about 10 days ago which gave him only temporary relief.  He also describes some discomfort over the left anterior superior iliac spine region.  He has intermittent discomfort in the left SI joint.  He has known history of degenerative disc disease of the cervical and lumbar spine.  He has not had much discomfort in the cervical region recently.  He has been having intermittent flares of lower back pain with radiculopathy but none recently.  None of the other joints are painful or swollen.  He had MRI of his left hip on May 10, 2024 which showed osteoarthritic changes, subchondral cystic changes, degenerative labral tear and  effusion in the hip joint.    Activities of Daily Living:  Patient reports morning stiffness for 0 minute.   Patient Reports nocturnal pain.  Difficulty dressing/grooming: Denies Difficulty climbing stairs: Denies Difficulty getting out of chair: Denies Difficulty using hands for taps, buttons, cutlery, and/or writing: Denies  Review of Systems  Constitutional:  Negative for fatigue.  HENT:  Negative for mouth sores and mouth dryness.   Eyes:  Negative for dryness.  Respiratory:  Negative for shortness of breath.   Cardiovascular:  Negative for chest pain and palpitations.  Gastrointestinal:  Negative for blood in stool, constipation and diarrhea.  Endocrine: Positive for increased urination.  Genitourinary:  Negative for involuntary urination.  Musculoskeletal:  Positive for joint pain, joint pain, myalgias, morning stiffness, muscle tenderness and myalgias. Negative for gait problem, joint swelling and muscle weakness.  Skin:  Negative for color change, rash, hair loss and sensitivity to sunlight.  Allergic/Immunologic: Negative for susceptible to infections.  Neurological:  Negative for dizziness and headaches.  Hematological:  Negative for swollen glands.  Psychiatric/Behavioral:  Positive for sleep disturbance. Negative for depressed mood. The patient is not nervous/anxious.     PMFS History:  Patient Active Problem List   Diagnosis Date Noted   Rhinitis, non-allergic 04/12/2024   Snoring 04/12/2024   Non-restorative sleep 04/12/2024   Nasal septal deviation 04/12/2024  Muscle mass 04/12/2024   Excessive daytime sleepiness 04/12/2024   History of nocturia 04/12/2024   Pain in joint of left knee 05/26/2022   Neck pain 10/22/2021   Elevated prostate specific antigen (PSA) 10/08/2021   Lumbar pain 06/19/2021   Pain of left hip joint 04/27/2020   Shortness of breath 05/05/2018   Abnormal stress ECG with treadmill 05/05/2018   Hypercholesteremia    Left sided colitis  [facility])     Past Medical History:  Diagnosis Date   Arthritis 03/2020   R hip   Asthma    Atherosclerotic heart disease of native coronary artery without angina pectoris    Atrial fibrillation (HCC)    DDD (degenerative disc disease), lumbar    Diabetes insipidus (HCC)    Elevated PSA    GERD (gastroesophageal reflux disease)    History of adenomatous polyp of colon    HLD (hyperlipidemia)    Hypercholesteremia    Insomnia    Internal hemorrhoids    Intracranial arachnoid cyst    Monoallelic mutation of AVP gene    Other chronic sinusitis    Other fatigue    PSA elevation    Sleep apnea    Snoring    Ulcerative colitis    Ulcerative colitis (HCC)     Family History  Problem Relation Age of Onset   Heart disease Father    Colon cancer Neg Hx    Esophageal cancer Neg Hx    Stomach cancer Neg Hx    Rectal cancer Neg Hx    Past Surgical History:  Procedure Laterality Date   APPENDECTOMY  2009   ATRIAL FIBRILLATION ABLATION     COLONOSCOPY     LEFT HEART CATH AND CORONARY ANGIOGRAPHY N/A 05/06/2018   Procedure: LEFT HEART CATH AND CORONARY ANGIOGRAPHY;  Surgeon: Ladona Heinz, MD;  Location: [facility]AS[facility];  Service: Cardiovascular;  Laterality: N/A;   LUMBAR LAMINECTOMY  1978   PROSTATE BIOPSY  06/07/2024   Social History   Social History Narrative   Not on file   Immunization History  Administered Date(s) Administered   Moderna SARS-COV2 Booster Vaccination 05/15/2021   Tdap 06/19/2021     Objective: Vital Signs: BP 137/69 (BP Location: Left Arm, Patient Position: Sitting, Cuff Size: Normal)   Pulse 67   Resp 14   Ht 5' 9 (1.753 m)   Wt 144 lb (65.3 kg)   BMI 21.27 kg/m    Physical Exam Vitals and nursing note reviewed.  Constitutional:      Appearance: He is well-developed.  HENT:     Head: Normocephalic and atraumatic.  Eyes:     Conjunctiva/sclera: Conjunctivae normal.     Pupils: Pupils are equal, round, and reactive to light.   Cardiovascular:     Rate and Rhythm: Normal rate and regular rhythm.     Heart sounds: Normal heart sounds.  Pulmonary:     Effort: Pulmonary effort is normal.     Breath sounds: Normal breath sounds.  Abdominal:     General: Bowel sounds are normal.     Palpations: Abdomen is soft.  Musculoskeletal:     Cervical back: Normal range of motion and neck supple.  Skin:    General: Skin is warm and dry.     Capillary Refill: Capillary refill takes less than 2 seconds.  Neurological:     Mental Status: He is alert and oriented to person, place, and time.  Psychiatric:        Behavior:  Behavior normal.      Musculoskeletal Exam: He had limited lateral rotation of the cervical spine more limited on the right side.  Thoracic kyphosis was noted.  He had limited range of motion of the lumbar spine.  He had no tenderness over SI joints.  Shoulders, elbows, wrist, MCPs PIPs and DIPs were in good range of motion with no synovitis.  He had very limited range of motion of bilateral hip joints with discomfort.  He had tenderness over bilateral trochanteric bursa.  He had tenderness over left anterior superior iliac spine.  Knee joints were in good range of motion without any warmth swelling or effusion.  There was no tenderness over ankles or MTPs.  Bilateral lower extremity edema was noted.  CDAI Exam: CDAI Score: -- Patient Global: --; Provider Global: -- Swollen: --; Tender: -- Joint Exam 07/12/2024   No joint exam has been documented for this visit   There is currently no information documented on the homunculus. Go to the Rheumatology activity and complete the homunculus joint exam.  Investigation: No additional findings.  Imaging: CT ABDOMEN PELVIS W CONTRAST Result Date: 06/29/2024 CLINICAL DATA:  Chronic abdominal pain. Lower extremity edema for several weeks. Ulcerative colitis. Prior appendectomy. EXAM: CT ABDOMEN AND PELVIS WITH CONTRAST TECHNIQUE: Multidetector CT imaging of the  abdomen and pelvis was performed using the standard protocol following bolus administration of intravenous contrast. RADIATION DOSE REDUCTION: This exam was performed according to the departmental dose-optimization program which includes automated exposure control, adjustment of the mA and/or kV according to patient size and/or use of iterative reconstruction technique. CONTRAST:  100mL ISOVUE -300 IOPAMIDOL  (ISOVUE -300) INJECTION 61% COMPARISON:  10/20/2022 FINDINGS: Lower Chest: No acute findings. Hepatobiliary: Stable sub-cm low-attenuation lesion in the right hepatic lobe which is too small to characterize consistent with benign etiology. No new or enlarging liver lesions identified. Gallbladder is unremarkable. No evidence of biliary ductal dilatation. Pancreas:  No mass or inflammatory changes. Spleen: Within normal limits in size and appearance. Adrenals/Urinary Tract: No suspicious masses identified. No evidence of ureteral calculi or hydronephrosis. Stomach/Bowel: No evidence of obstruction, inflammatory process or abnormal fluid collections. No abnormal bowel wall thickening. Vascular/Lymphatic: No pathologically enlarged lymph nodes. No acute vascular findings. Reproductive: Mildly enlarged prostate. No other significant abnormality. Other:  None. Musculoskeletal: No suspicious bone lesions identified. Severe bilateral hip osteoarthritis noted. IMPRESSION: No acute findings within the abdomen or pelvis. Mildly enlarged prostate. Electronically Signed   By: Norleen DELENA Kil M.D.   On: 06/29/2024 09:06    Recent Labs: Lab Results  Component Value Date   WBC 6.6 03/21/2024   HGB 14.2 03/21/2024   PLT 207.0 03/21/2024   NA 139 03/21/2024   K 4.0 03/21/2024   CL 102 03/21/2024   CO2 31 03/21/2024   GLUCOSE 87 03/21/2024   BUN 18 03/21/2024   CREATININE 0.75 03/21/2024   BILITOT 1.1 03/21/2024   ALKPHOS 74 03/21/2024   AST 13 03/21/2024   ALT 12 03/21/2024   PROT 6.8 04/12/2024   ALBUMIN 3.9  03/21/2024   CALCIUM  9.5 03/21/2024   GFRAA >90 01/25/2012   QFTBGOLDPLUS NEGATIVE 03/21/2024     Speciality Comments: No specialty comments available.  Procedures:  No procedures performed Allergies: Amoxicillin-pot clavulanate, Cephalexin, Doxycycline, Levofloxacin , Metronidazole, and Prednisone    Assessment / Plan:     Visit Diagnoses: Pain of left hip joint-he has been having severe pain and discomfort in his left hip for the last 1 year.  He states the symptoms are gradually  getting worse.  He has been followed by Dr.Gioffre.  Patient reports having several trochanteric bursa injections and also left hip joint injection without much relief.  May 10, 2024 MRI of the left hip at [facility] impression:  #1severe left hip osteoarthritis with subchondral cystic changes and subchondral marrow edema, osteophyte formation, labral degenerative tearing and large hip joint effusion with synovitis.  #2 mild left SI arthrosis  #3 intact gluteal tendons.  Trochanteric bursitis, left hip-he had tenderness over bilateral trochanteric bursa more prominent on the left side.  He has had 5 trochanteric bursa injections since May 2024.  Last trochanteric bursa injection was 10 days ago.  He did not have much relief from the last trochanteric bursa injection.  He also had tenderness over the left anterior superior iliac spine.  Effusion of left hip-he was found to have an effusion in his left hip joint.  He is requesting left hip joint aspiration through radiology.  He will be moving out of town in 1 month and wants to expedite the process.  Will try to schedule an appointment and also will request to get synovial fluid analysis for cell count, crystals and culture.  It would be too important to differentiate if the synovial fluid is inflammatory or due to osteoarthritis.  Primary osteoarthritis of both hips-he has severe end-stage osteoarthritis of bilateral hip joints.  He is unable to undergo total hip  replacement due to multiple other health problems.  He is also will be moving in the next month.  He has noticed right leg length shortening due to severe osteoarthritis.  He has lost a lot of muscle mass in his lower extremities.  He would benefit from total hip replacement eventually.  I also encouraged him to do bilateral lower extremity exercises to strengthen muscle mass prior to the surgery.  He was taking Celebrex  in the past which was helping with the hip joint pain.  He had to discontinue Celebrex  due to GI side effects.  DDD (degenerative disc disease), cervical-he has been diagnosed with disc disease of cervical spine.  He has had x-rays of his cervical spine and MRI of the cervical spine at [facility].  He has limited range of motion of the cervical spine but not much discomfort.  He denies any radiculopathy.  Degeneration of intervertebral disc of lumbar region without discogenic back pain or lower extremity pain-he has chronic intermittent discomfort in his lower back pain with radiculopathy.  He has not had any recent radiculopathy.  Other ulcerative colitis with other complication (HCC) -he has known history of ulcerative colitis for many years.  He is followed by gastroenterologist.  He had a severe flare of ulcerative colitis in December 2023.  At the time the dose of mesalamine  was increased.  Then he was tried on budesonide  which she could not tolerate.  He started Entyvio  on May 19, 2024.  He has had only few doses of Entyvio .  He is also on mesalamine .  If he has inflammatory arthritis he may benefit from switching just to some other medication which will be effective for the joint disease.  Hypercholesteremia-he is on Crestor .  Elevated prostate specific antigen (PSA)-he underwent prostate biopsy and also having frequency of urination.  He is on Flomax .  He also had recent E. coli urinary tract infection per patient which was treated with antibiotics.  Non-restorative  sleep  Rhinitis, nonallergic  History of UTI-according the patient he was treated for E. coli UTI recently.    Orders: No  orders of the defined types were placed in this encounter.  No orders of the defined types were placed in this encounter.   Face-to-face time spent with patient was 50 minutes. Greater than 50% of time was spent in counseling and coordination of care.  Follow-Up Instructions: Return if symptoms worsen or fail to improve, for Osteoarthritis.   Maya Nash, MD  Note - This record has been created using Animal nutritionist.  Chart creation errors have been sought, but may not always  have been located. Such creation errors do not reflect on  the standard of medical care.

## 2024-07-12 NOTE — Patient Instructions (Signed)
Exercises for Chronic Knee Pain Chronic knee pain is pain that lasts longer than 3 months. For most people with chronic knee pain, exercise and weight loss is an important part of treatment. Your health care provider may want you to focus on: Making the muscles that support your knee stronger. This can take pressure off your knee and reduce pain. Preventing knee stiffness. How far you can move your knee, keeping it there or making it farther. Losing weight (if this applies) to take pressure off your knee, lower your risk for injury, and make it easier for you to exercise. Your provider will help you make an exercise program that fits your needs and physical abilities. Below are simple, low-impact exercises you can do at home. Ask your provider or physical therapist how often you should do your exercise program and how many times to repeat each exercise. General safety tips  Get your provider's approval before doing any exercises. Start slowly and stop any time you feel pain. Do not exercise if your knee pain is flaring up. Warm up first. Stretching a cold muscle can cause an injury. Do 5-10 minutes of easy movement or light stretching before beginning your exercises. Do 5-10 minutes of low-impact activity (like walking or cycling) before starting strengthening exercises. Contact your provider any time you have pain during or after exercising. Exercise can cause discomfort but should not be painful. It is normal to be a little stiff or sore after exercising. Stretching and range-of-motion exercises Front thigh stretch  Stand up straight and support your body by holding on to a chair or resting one hand on a wall. With your legs straight and close together, bend one knee to lift your heel up toward your butt. Using one hand for support, grab your ankle with your free hand. Pull your foot up closer toward your butt to feel the stretch in front of your thigh. Hold the stretch for 30  seconds. Repeat __________ times. Complete this exercise __________ times a day. Back thigh stretch  Sit on the floor with your back straight and your legs out straight in front of you. Place the palms of your hands on the floor and slide them toward your feet as you bend at the hip. Try to touch your nose to your knees and feel the stretch in the back of your thighs. Hold for 30 seconds. Repeat __________ times. Complete this exercise __________ times a day. Calf stretch  Stand facing a wall. Place the palms of your hands flat against the wall, arms extended, and lean slightly against the wall. Get into a lunge position with one leg bent at the knee and the other leg stretched out straight behind you. Keep both feet facing the wall and increase the bend in your knee while keeping the heel of the other leg flat on the ground. You should feel the stretch in your calf. Hold for 30 seconds. Repeat __________ times. Complete this exercise __________ times a day. Strengthening exercises Straight leg lift  Lie on your back with one knee bent and the other leg out straight. Slowly lift the straight leg without bending the knee. Lift until your foot is about 12 inches (30 cm) off the floor. Hold for 3-5 seconds and slowly lower your leg. Repeat __________ times. Complete this exercise __________ times a day. Single leg dip  Stand between two chairs and put both hands on the backs of the chairs for support. Extend one leg out straight with your body  weight resting on the heel of the standing leg. Slowly bend your standing knee to dip your body to the level that is comfortable for you. Hold for 3-5 seconds. Repeat __________ times. Complete this exercise __________ times a day. Hamstring curls  Stand straight, knees close together, facing the back of a chair. Hold on to the back of a chair with both hands. Keep one leg straight. Bend the other knee while bringing the heel up toward the butt  until the knee is bent at a 90-degree angle (right angle). Hold for 3-5 seconds. Repeat __________ times. Complete this exercise __________ times a day. Wall squat  Stand straight with your back, hips, and head against a wall. Step forward one foot at a time with your back still against the wall. Your feet should be 2 feet (61 cm) from the wall at shoulder width. Keeping your back, hips, and head against the wall, slide down the wall to as close to a sitting position as you can get. Hold for 5-10 seconds, then slowly slide back up. Repeat __________ times. Complete this exercise __________ times a day. Step-ups  Stand in front of a sturdy platform or stool that is about 6 inches (15 cm) high. Slowly step up with your left / right foot, keeping your knee in line with your hip and foot. Do not let your knee bend so far that you cannot see your toes. Hold on to a chair for balance, but do not use it for support. Slowly unlock your knee and lower yourself to the starting position. Repeat __________ times. Complete this exercise __________ times a day. Contact a health care provider if: Your exercises cause pain. Your pain is worse after you exercise. Your pain prevents you from doing your exercises. This information is not intended to replace advice given to you by your health care provider. Make sure you discuss any questions you have with your health care provider. Document Revised: 12/16/2022 Document Reviewed: 12/16/2022 Elsevier Patient Education  2024 [employer]   Iliotibial Band Syndrome Rehab Ask your health care provider which exercises are safe for you. Do exercises exactly as told by your provider and adjust them as told. It's normal to feel mild stretching, pulling, tightness, or discomfort as you do these exercises. Stop right away if you feel sudden pain or your pain gets a lot worse. Do not begin these exercises until told by your provider. Stretching and range-of-motion  exercises These exercises warm up your muscles and joints. They also improve the movement and flexibility of your hip and pelvis. Quadriceps stretch, prone  Lie face down (prone) on a firm surface like a bed or padded floor. Bend your left / right knee. Reach back to hold your ankle or pant leg. If you can't reach your ankle or pant leg, use a belt looped around your foot and grab the belt instead. Gently pull your heel toward your butt. Your knee should not slide out to the side. You should feel a stretch in the front of your thigh and knee, also called the quadriceps. Hold this position for __________ seconds. Repeat __________ times. Complete this exercise __________ times a day. Iliotibial band stretch The iliotibial band is a strip of tissue that runs along the outside of your hip down to your knee. Lie on your side with your left / right leg on top. Bend both knees and grab your left / right ankle. Stretch out your bottom arm to help you balance. Slowly bring  your top knee back so your thigh goes behind your back. Slowly lower your top leg toward the floor until you feel a gentle stretch on the outside of your left / right hip and thigh. If you don't feel a stretch and your knee won't go farther, place the heel of your other foot on top of your knee and pull your knee down toward the floor with your foot. Hold this position for __________ seconds. Repeat __________ times. Complete this exercise __________ times a day. Strengthening exercises These exercises build strength and endurance in your hip and pelvis. Endurance means your muscles can keep working even when they're tired. Straight leg raises, side-lying This exercise strengthens the muscles that rotate the leg at the hip and move it away from your body. These muscles are called hip abductors. Lie on your side with your left / right leg on top. Lie so your head, shoulder, hip, and knee line up. You can bend your bottom knee to help  you balance. Roll your hips slightly forward so they're stacked directly over each other. Your left / right knee should face forward. Tense the muscles in your outer thigh and hip. Lift your top leg 4-6 inches (10-15 cm) off the ground. Hold this position for __________ seconds. Slowly lower your leg back down to the starting position. Let your muscles fully relax before doing this exercise again. Repeat __________ times. Complete this exercise __________ times a day. Leg raises, prone This exercise strengthens the muscles that move the hips backward. These muscles are called hip extensors. Lie face down (prone) on your bed or a firm surface. You can put a pillow under your hips for comfort and to support your lower back. Bend your left / right knee so your foot points straight up toward the ceiling. Keep the other leg straight and behind you. Squeeze your butt muscles. Lift your left / right thigh off the firm surface. Do not let your back arch. Tense your thigh muscle as hard as you can without having more knee pain. Hold this position for __________ seconds. Slowly lower your leg to the starting position. Allow your leg to relax all the way. Repeat __________ times. Complete this exercise __________ times a day. Hip hike  Stand sideways on a bottom step. Place your feet so that your left / right leg is on the step, and the other foot is hanging off the side. If you need support for balance, hold onto a railing or wall. Keep your knees straight and your abdomen square, meaning your hips are level. Then, lift your left / right hip up toward the ceiling. Slowly let your leg that's hanging off the step lower towards the floor. Your foot should get closer to the ground. Do not lean or bend your knees during this movement. Repeat __________ times. Complete this exercise __________ times a day. This information is not intended to replace advice given to you by your health care provider. Make sure  you discuss any questions you have with your health care provider. Document Revised: 02/13/2023 Document Reviewed: 02/13/2023 Elsevier Patient Education  2024 [employer].

## 2024-07-12 NOTE — Telephone Encounter (Signed)
 Pt is a retired endocrinologist. Pt states he started having pain this weekend similar to what he has had with colitis. He took an antibiotic on Saturday and it did not seem to bother him. Describes the pain as slightly crampy mainly on his left side. He has had some loose stools. He is taking entyvio  and has had 3 infusions. He had 1 Budesonide  tablet left and took it and seemed to do better that day. See note below feom mychart:  Salena Sor, MD to [facility] (supporting Alan JONELLE Coombs, PA-C)     07/12/24 12:27 PM Hi Alan, I am messaging you since Dr. Albertus appears to be out of office. For the last two or three days I've had on and off, abdominal pain, now mostly left upper quadrant. Early morning I had a couple of slightly loose stools also. No nausea. I am taking my Lialda  4 tabs as before and I have had three infusions of Entyvio . Not sure what the cause is; on Saturday I took fosfomycin for prostatitis without much GI effects. I have not had a stool test lalety . Please advise. Thanks.   Pt is requesting to be called by the covering provider. He knows Dr Albertus is out of the office. Let him know note would be sent to Ellouise Console PA.

## 2024-07-12 NOTE — Progress Notes (Deleted)
 Office Visit Note  Patient: Corey Sor, [name]             Date of Birth: 10-15-1956           MRN: [id number]             PCP: Janey Santos, [name] Referring: Janey Santos, [name] Visit Date: 07/13/2024 Occupation: @GUAROCC @  Subjective:  No chief complaint on file.   History of Present Illness: Corey Sor, [name] is a 68 y.o. male ***with history of osteoarthritis.  He returns today after his last visit in 2019.  At the time he was evaluated for tendinitis in the right peroneus longus tendon.    Activities of Daily Living:  Patient reports morning stiffness for *** {minute/hour:19697}.   Patient {ACTIONS;DENIES/REPORTS:21021675::Denies} nocturnal pain.  Difficulty dressing/grooming: {ACTIONS;DENIES/REPORTS:21021675::Denies} Difficulty climbing stairs: {ACTIONS;DENIES/REPORTS:21021675::Denies} Difficulty getting out of chair: {ACTIONS;DENIES/REPORTS:21021675::Denies} Difficulty using hands for taps, buttons, cutlery, and/or writing: {ACTIONS;DENIES/REPORTS:21021675::Denies}  No Rheumatology ROS completed.   PMFS History:  Patient Active Problem List   Diagnosis Date Noted   Rhinitis, non-allergic 04/12/2024   Snoring 04/12/2024   Non-restorative sleep 04/12/2024   Nasal septal deviation 04/12/2024   Muscle mass 04/12/2024   Excessive daytime sleepiness 04/12/2024   History of nocturia 04/12/2024   Pain in joint of left knee 05/26/2022   Neck pain 10/22/2021   Elevated prostate specific antigen (PSA) 10/08/2021   Lumbar pain 06/19/2021   Pain of left hip joint 04/27/2020   Shortness of breath 05/05/2018   Abnormal stress ECG with treadmill 05/05/2018   Hypercholesteremia    Left sided colitis [facility])     Past Medical History:  Diagnosis Date   Arthritis 03/2020   R hip   Asthma    Atherosclerotic heart disease of native coronary artery without angina pectoris    Atrial fibrillation (HCC)    DDD (degenerative disc disease), lumbar    Diabetes insipidus (HCC)     Elevated PSA    GERD (gastroesophageal reflux disease)    History of adenomatous polyp of colon    HLD (hyperlipidemia)    Hypercholesteremia    Insomnia    Internal hemorrhoids    Intracranial arachnoid cyst    Monoallelic mutation of AVP gene    Other chronic sinusitis    Other fatigue    PSA elevation    Snoring    Ulcerative colitis    Ulcerative colitis (HCC)     Family History  Problem Relation Age of Onset   Heart disease Father    Colon cancer Neg Hx    Esophageal cancer Neg Hx    Stomach cancer Neg Hx    Rectal cancer Neg Hx    Past Surgical History:  Procedure Laterality Date   APPENDECTOMY  2009   ATRIAL FIBRILLATION ABLATION     COLONOSCOPY     LEFT HEART CATH AND CORONARY ANGIOGRAPHY N/A 05/06/2018   Procedure: LEFT HEART CATH AND CORONARY ANGIOGRAPHY;  Surgeon: Ladona Heinz, [name];  Location: [facility];  Service: Cardiovascular;  Laterality: N/A;   LUMBAR LAMINECTOMY  1978   Social History   Social History Narrative   Not on file   Immunization History  Administered Date(s) Administered   Moderna SARS-COV2 Booster Vaccination 05/15/2021   Tdap 06/19/2021     Objective: Vital Signs: There were no vitals taken for this visit.   Physical Exam   Musculoskeletal Exam: ***  CDAI Exam: CDAI Score: -- Patient Global: --; Provider Global: -- Swollen: --; Tender: -- Joint  Exam 07/13/2024   No joint exam has been documented for this visit   There is currently no information documented on the homunculus. Go to the Rheumatology activity and complete the homunculus joint exam.  Investigation: No additional findings.  Imaging: CT ABDOMEN PELVIS W CONTRAST Result Date: 06/29/2024 CLINICAL DATA:  Chronic abdominal pain. Lower extremity edema for several weeks. Ulcerative colitis. Prior appendectomy. EXAM: CT ABDOMEN AND PELVIS WITH CONTRAST TECHNIQUE: Multidetector CT imaging of the abdomen and pelvis was performed using the standard protocol  following bolus administration of intravenous contrast. RADIATION DOSE REDUCTION: This exam was performed according to the departmental dose-optimization program which includes automated exposure control, adjustment of the mA and/or kV according to patient size and/or use of iterative reconstruction technique. CONTRAST:  100mL ISOVUE -300 IOPAMIDOL  (ISOVUE -300) INJECTION 61% COMPARISON:  10/20/2022 FINDINGS: Lower Chest: No acute findings. Hepatobiliary: Stable sub-cm low-attenuation lesion in the right hepatic lobe which is too small to characterize consistent with benign etiology. No new or enlarging liver lesions identified. Gallbladder is unremarkable. No evidence of biliary ductal dilatation. Pancreas:  No mass or inflammatory changes. Spleen: Within normal limits in size and appearance. Adrenals/Urinary Tract: No suspicious masses identified. No evidence of ureteral calculi or hydronephrosis. Stomach/Bowel: No evidence of obstruction, inflammatory process or abnormal fluid collections. No abnormal bowel wall thickening. Vascular/Lymphatic: No pathologically enlarged lymph nodes. No acute vascular findings. Reproductive: Mildly enlarged prostate. No other significant abnormality. Other:  None. Musculoskeletal: No suspicious bone lesions identified. Severe bilateral hip osteoarthritis noted. IMPRESSION: No acute findings within the abdomen or pelvis. Mildly enlarged prostate. Electronically Signed   By: Norleen DELENA Kil M.D.   On: 06/29/2024 09:06    Recent Labs: Lab Results  Component Value Date   WBC 6.6 03/21/2024   HGB 14.2 03/21/2024   PLT 207.0 03/21/2024   NA 139 03/21/2024   K 4.0 03/21/2024   CL 102 03/21/2024   CO2 31 03/21/2024   GLUCOSE 87 03/21/2024   BUN 18 03/21/2024   CREATININE 0.75 03/21/2024   BILITOT 1.1 03/21/2024   ALKPHOS 74 03/21/2024   AST 13 03/21/2024   ALT 12 03/21/2024   PROT 6.8 04/12/2024   ALBUMIN 3.9 03/21/2024   CALCIUM  9.5 03/21/2024   GFRAA >90 01/25/2012    QFTBGOLDPLUS NEGATIVE 03/21/2024    Speciality Comments: No specialty comments available.  Procedures:  No procedures performed Allergies: Amoxicillin-pot clavulanate, Cephalexin, Doxycycline, Levofloxacin , Metronidazole, and Prednisone    Assessment / Plan:     Visit Diagnoses: No diagnosis found.  Orders: No orders of the defined types were placed in this encounter.  No orders of the defined types were placed in this encounter.   Face-to-face time spent with patient was *** minutes. Greater than 50% of time was spent in counseling and coordination of care.  Follow-Up Instructions: No follow-ups on file.   Maya Nash, [name]  Note - This record has been created using Animal nutritionist.  Chart creation errors have been sought, but may not always  have been located. Such creation errors do not reflect on  the standard of medical care.

## 2024-07-12 NOTE — Telephone Encounter (Signed)
 Inbound call from patient requesting to speak to the Dr. Albertus or the on call doctor in regards to his abdominal. Patient stated that she abdominal pain has gotten more intense. Patient is requesting a call back. Please advise.

## 2024-07-13 ENCOUNTER — Telehealth: Payer: Self-pay

## 2024-07-13 ENCOUNTER — Other Ambulatory Visit: Payer: Self-pay | Admitting: *Deleted

## 2024-07-13 ENCOUNTER — Other Ambulatory Visit: Payer: Self-pay

## 2024-07-13 ENCOUNTER — Other Ambulatory Visit (INDEPENDENT_AMBULATORY_CARE_PROVIDER_SITE_OTHER)

## 2024-07-13 ENCOUNTER — Telehealth: Payer: Self-pay | Admitting: Internal Medicine

## 2024-07-13 ENCOUNTER — Ambulatory Visit: Admitting: Rheumatology

## 2024-07-13 DIAGNOSIS — K51319 Ulcerative (chronic) rectosigmoiditis with unspecified complications: Secondary | ICD-10-CM | POA: Diagnosis not present

## 2024-07-13 DIAGNOSIS — R1012 Left upper quadrant pain: Secondary | ICD-10-CM | POA: Diagnosis not present

## 2024-07-13 DIAGNOSIS — K5150 Left sided colitis without complications: Secondary | ICD-10-CM

## 2024-07-13 LAB — CBC WITH DIFFERENTIAL/PLATELET
Basophils Absolute: 0 K/uL (ref 0.0–0.1)
Basophils Relative: 0.5 % (ref 0.0–3.0)
Eosinophils Absolute: 0.1 K/uL (ref 0.0–0.7)
Eosinophils Relative: 1.1 % (ref 0.0–5.0)
HCT: 39.1 % (ref 39.0–52.0)
Hemoglobin: 13.3 g/dL (ref 13.0–17.0)
Lymphocytes Relative: 24.6 % (ref 12.0–46.0)
Lymphs Abs: 2.4 K/uL (ref 0.7–4.0)
MCHC: 33.9 g/dL (ref 30.0–36.0)
MCV: 91.6 fl (ref 78.0–100.0)
Monocytes Absolute: 0.9 K/uL (ref 0.1–1.0)
Monocytes Relative: 8.9 % (ref 3.0–12.0)
Neutro Abs: 6.3 K/uL (ref 1.4–7.7)
Neutrophils Relative %: 64.9 % (ref 43.0–77.0)
Platelets: 268 K/uL (ref 150.0–400.0)
RBC: 4.27 Mil/uL (ref 4.22–5.81)
RDW: 13.8 % (ref 11.5–15.5)
WBC: 9.6 K/uL (ref 4.0–10.5)

## 2024-07-13 LAB — COMPREHENSIVE METABOLIC PANEL WITH GFR
ALT: 13 U/L (ref 0–53)
AST: 12 U/L (ref 0–37)
Albumin: 4 g/dL (ref 3.5–5.2)
Alkaline Phosphatase: 77 U/L (ref 39–117)
BUN: 17 mg/dL (ref 6–23)
CO2: 30 meq/L (ref 19–32)
Calcium: 9.6 mg/dL (ref 8.4–10.5)
Chloride: 100 meq/L (ref 96–112)
Creatinine, Ser: 0.67 mg/dL (ref 0.40–1.50)
GFR: 94.9 mL/min (ref >60.00)
Glucose, Bld: 94 mg/dL (ref 70–99)
Potassium: 3.7 meq/L (ref 3.5–5.1)
Sodium: 136 meq/L (ref 135–145)
Total Bilirubin: 1.4 mg/dL — ABNORMAL HIGH (ref 0.2–1.2)
Total Protein: 7 g/dL (ref 6.0–8.3)

## 2024-07-13 LAB — C-REACTIVE PROTEIN: CRP: <1 mg/dL (ref 0.5–20.0)

## 2024-07-13 LAB — LIPASE: Lipase: 26 U/L (ref 11.0–59.0)

## 2024-07-13 NOTE — Telephone Encounter (Signed)
 Dr Shila this pt is requesting to talk to you.

## 2024-07-13 NOTE — Telephone Encounter (Signed)
 Pt showed up in the lobby requesting to be seen today. No appts today. Let pt know we can see him tomorrow at 3pm with Deanna May NP. He knows if his abd pain worsens he can always go to the er if he thinks he needs to. Pt aware of appt.

## 2024-07-13 NOTE — Telephone Encounter (Signed)
 Orders are in, see mychart message.

## 2024-07-13 NOTE — Telephone Encounter (Signed)
 Inbound call from patient stating he would like to know if the labs requested were put in. Patient states he would like to speak to nurse Requesting a call back  Please advise  Thank you

## 2024-07-13 NOTE — Telephone Encounter (Signed)
 Orders in for labs, pt aware.

## 2024-07-14 ENCOUNTER — Other Ambulatory Visit: Payer: Self-pay

## 2024-07-14 ENCOUNTER — Ambulatory Visit: Admitting: Internal Medicine

## 2024-07-14 ENCOUNTER — Ambulatory Visit: Payer: Self-pay | Admitting: Physician Assistant

## 2024-07-14 ENCOUNTER — Encounter: Payer: Self-pay | Admitting: Internal Medicine

## 2024-07-14 ENCOUNTER — Ambulatory Visit: Admitting: Gastroenterology

## 2024-07-14 ENCOUNTER — Encounter: Payer: Self-pay | Admitting: Gastroenterology

## 2024-07-14 VITALS — BP 129/73 | HR 75 | Temp 97.3°F | Wt 147.0 lb

## 2024-07-14 VITALS — BP 112/60 | HR 77 | Ht 69.0 in | Wt 143.2 lb

## 2024-07-14 DIAGNOSIS — Z1612 Extended spectrum beta lactamase (ESBL) resistance: Secondary | ICD-10-CM | POA: Diagnosis not present

## 2024-07-14 DIAGNOSIS — K2970 Gastritis, unspecified, without bleeding: Secondary | ICD-10-CM

## 2024-07-14 DIAGNOSIS — K51319 Ulcerative (chronic) rectosigmoiditis with unspecified complications: Secondary | ICD-10-CM | POA: Diagnosis not present

## 2024-07-14 DIAGNOSIS — N390 Urinary tract infection, site not specified: Secondary | ICD-10-CM | POA: Diagnosis not present

## 2024-07-14 DIAGNOSIS — B9620 Unspecified Escherichia coli [E. coli] as the cause of diseases classified elsewhere: Secondary | ICD-10-CM

## 2024-07-14 MED ORDER — NITROFURANTOIN MONOHYD MACRO 100 MG PO CAPS
100.0000 mg | ORAL_CAPSULE | Freq: Two times a day (BID) | ORAL | 0 refills | Status: AC
Start: 2024-07-14 — End: 2024-07-24

## 2024-07-14 NOTE — Progress Notes (Signed)
 Chief Complaint: Follow-up abdominal pain Primary GI Doctor: Dr. 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      Patient was last seen in the GI office by Dr. Albertus on 04/05/2024.  Patient at that time was having recurrent symptoms of epigastric and left-sided abdominal pain.  At that time patient was placed on Uceris  9 mg daily for 8 weeks.  Patient also instructed to stay away from meloxicam  and NSAIDs.  Interval History     Patient presents for evaluation of upper epigastric pain that radiates to both the left and right side as well as above the umbilicus.  Patient describes the pain as a gnawing pain that is steady.  Patient reports the pain started this weekend but cannot think of anything that has contributed to his symptoms.  Patient has recently started another round of Fosfomycin with urology. No changes to diet.  He reports he did start taking a over-the-counter probiotic.    He has taken Nexium  and Pepcid prn to see if it would help with symptoms which he states helped minimally.  Patient not taking levsin  due to side effect of urinary retention, but reports it has helped in the past for abdominal pain. He reports on Tuesday he had two semi formed stools, but now stools back to normal. No blood in stool. Patient taking Lialda  4 tablets p.o. daily and has had 3 infusions of Entyvio . Patient reports he had 1 budesonide  tablet left and took it and seem to do better that day. He reports he was prescribed budesonide  back in April for 2 months but only took a month of it because it caused insomnia.     Patient did call and speak with the on-call doc today Dr. Nandigam and she recommended the patient pick up over-the-counter Gas-X and Ibgard, take scheduled for possible abdominal spasm/IBS.  Patient informs me he has  picked up the medications and plans to start them as instructed.      Patient does admit he has been under increased stress recently with current prostate issues.  When he has active infection he does have lower pelvic pain.   He has also had a lot of bilateral hip pain and seeing a specialist for this.   Wt Readings from Last 3 Encounters:  07/14/24 143 lb 4 oz (65 kg)  07/14/24 147 lb (66.7 kg)  07/12/24 144 lb (65.3 kg)    Past Medical History:  Diagnosis Date   Arthritis 03/2020   R hip   Asthma    Atherosclerotic heart disease of native coronary artery without angina pectoris    Atrial fibrillation (HCC)    DDD (degenerative disc disease), lumbar    Diabetes insipidus (HCC)    Elevated PSA    GERD (gastroesophageal reflux disease)    History of adenomatous polyp of colon    HLD (hyperlipidemia)    Hypercholesteremia    Insomnia    Internal hemorrhoids    Intracranial arachnoid cyst    Monoallelic mutation of AVP gene    Other chronic sinusitis    Other fatigue    PSA elevation    Sleep apnea    Snoring    Ulcerative colitis    Ulcerative colitis (HCC)     Past Surgical History:  Procedure Laterality Date   APPENDECTOMY  2009   ATRIAL FIBRILLATION ABLATION  COLONOSCOPY     LEFT HEART CATH AND CORONARY ANGIOGRAPHY N/A 05/06/2018   Procedure: LEFT HEART CATH AND CORONARY ANGIOGRAPHY;  Surgeon: Ladona Heinz, MD;  Location: [facility];  Service: Cardiovascular;  Laterality: N/A;   LUMBAR LAMINECTOMY  1978   PROSTATE BIOPSY  06/07/2024    Current Outpatient Medications  Medication Sig Dispense Refill   acetaminophen  (TYLENOL ) 500 MG tablet Take 500-1,000 mg by mouth as needed.     azelastine  (ASTELIN ) 0.1 % nasal spray Place 1 spray into both nostrils 2 (two) times daily. 30 mL 2   Cholecalciferol (VITAMIN D3) 125 MCG (5000 UT) TABS Take 5,000 tablets by mouth. Twice weekly.     desmopressin  (DDAVP ) 0.2 MG tablet Take 1 tablet (0.2 mg total) by mouth 2  (two) times daily as directed 60 tablet 12   diclofenac  Sodium (VOLTAREN ) 1 % GEL Apply 2 g topically to the affected area(s) 4 (four) times daily. 100 g 5   famotidine (PEPCID) 20 MG tablet Take 20 mg by mouth daily as needed.     fexofenadine (ALLEGRA) 180 MG tablet Take 180 mg by mouth.     Fluticasone  Propionate (XHANCE) 93 MCG/ACT EXHU Place 2 sprays into the nose 2 times daily at 12 noon and 4 pm.     hyoscyamine  (LEVSIN  SL) 0.125 MG SL tablet Take 1 by mouth every 6-8 hours as needed 30 tablet 2   ipratropium (ATROVENT ) 0.06 % nasal spray Place 2 sprays into both nostrils 3 (three) times daily. 15 mL 5   mesalamine  (LIALDA ) 1.2 g EC tablet Take 2 tablets (2.4 g total) by mouth in the morning and at bedtime. 360 tablet 1   methocarbamol  (ROBAXIN ) 500 MG tablet Take 1 tablet (500 mg total) by mouth every 8 (eight) hours. 90 tablet 1   nitrofurantoin , macrocrystal-monohydrate, (MACROBID ) 100 MG capsule Take 1 capsule (100 mg total) by mouth 2 (two) times daily for 10 days. 20 capsule 0   rosuvastatin  (CRESTOR ) 20 MG tablet Take 1 tablet (20 mg total) by mouth daily. 90 tablet 4   tamsulosin  (FLOMAX ) 0.4 MG CAPS capsule Take 1 capsule (0.4 mg total) by mouth daily. 20 capsule 0   Vedolizumab  (ENTYVIO  IV) Inject into the vein.     metoprolol  tartrate (LOPRESSOR ) 25 MG tablet Take 0.5 tablets (12.5 mg total) by mouth 2 (two) times daily as needed. (Patient not taking: Reported on 07/14/2024) 90 tablet 3   Zolpidem  Tartrate 1.75 MG SUBL Place 1 tablet under the tongue once a night. (Patient not taking: Reported on 07/14/2024) 10 tablet 0   No current facility-administered medications for this visit.    Allergies as of 07/14/2024 - Review Complete 07/14/2024  Allergen Reaction Noted   Amoxicillin-pot clavulanate Diarrhea and Nausea Only 12/18/2022   Cephalexin  05/10/2021   Doxycycline Diarrhea and Nausea Only 12/18/2022   Levofloxacin   02/24/2023   Metronidazole  05/10/2021   Prednisone    02/24/2023    Family History  Problem Relation Age of Onset   Heart disease Father    Colon cancer Neg Hx    Esophageal cancer Neg Hx    Stomach cancer Neg Hx    Rectal cancer Neg Hx     Review of Systems:    Constitutional: No weight loss, fever, chills, weakness or fatigue HEENT: Eyes: No change in vision               Ears, Nose, Throat:  No change in hearing or congestion Skin: No  rash or itching Cardiovascular: No chest pain, chest pressure or palpitations   Respiratory: No SOB or cough Gastrointestinal: See HPI and otherwise negative Genitourinary: No dysuria or change in urinary frequency Neurological: No headache, dizziness or syncope Musculoskeletal: No new muscle or joint pain Hematologic: No bleeding or bruising Psychiatric: No history of depression or anxiety    Physical Exam:  Vital signs: BP 112/60 (BP Location: Left Arm, Patient Position: Sitting, Cuff Size: Normal)   Pulse 77   Ht 5' 9 (1.753 m)   Wt 143 lb 4 oz (65 kg)   BMI 21.15 kg/m   Constitutional:   Pleasant male appears to be in NAD, Well developed, Well nourished, alert and cooperative Throat: Oral cavity and pharynx without inflammation, swelling or lesion.  Respiratory: Respirations even and unlabored. Lungs clear to auscultation bilaterally.   No wheezes, crackles, or rhonchi.  Cardiovascular: Normal S1, S2. Regular rate and rhythm. No peripheral edema, cyanosis or pallor.  Gastrointestinal:  Soft, nondistended, generalized abdominal tenderness. No rebound or guarding. Normal bowel sounds. No appreciable masses or hepatomegaly. Rectal:  Not performed.  Msk:  Symmetrical without gross deformities. Without edema, no deformity or joint abnormality.  Neurologic:  Alert and  oriented x4;  grossly normal neurologically.  Skin:   Dry and intact without significant lesions or rashes.  RELEVANT LABS AND IMAGING: CBC    Latest Ref Rng & Units 07/13/2024    3:57 PM 03/21/2024    2:37 PM 11/13/2022     5:03 PM  CBC  WBC 4.0 - 10.5 K/uL 9.6  6.6  5.2   Hemoglobin 13.0 - 17.0 g/dL 86.6  85.7  85.3   Hematocrit 39.0 - 52.0 % 39.1  43.3  43.0   Platelets 150.0 - 400.0 K/uL 268.0  207.0  244.0      CMP     Latest Ref Rng & Units 07/13/2024    3:57 PM 04/12/2024   12:15 PM 03/21/2024    2:37 PM  CMP  Glucose 70 - 99 mg/dL 94   87   BUN 6 - 23 mg/dL 17   18   Creatinine 9.59 - 1.50 mg/dL 9.32   9.24   Sodium 864 - 145 mEq/L 136   139   Potassium 3.5 - 5.1 mEq/L 3.7   4.0   Chloride 96 - 112 mEq/L 100   102   CO2 19 - 32 mEq/L 30   31   Calcium  8.4 - 10.5 mg/dL 9.6   9.5   Total Protein 6.0 - 8.3 g/dL 7.0  6.8  7.1   Total Bilirubin 0.2 - 1.2 mg/dL 1.4   1.1   Alkaline Phos 39 - 117 U/L 77   74   AST 0 - 37 U/L 12   13   ALT 0 - 53 U/L 13   12   06/27/2024 labs show: BUN 11, creatinine 0.7, normal LFTs, WBC 5.10, Hgb 12.7 07/13/2024 labs show CRP less than 1, lipase 26 Fecal calprotectin- pending  Imaging: 06/29/2024 CT abdomen pelvis with contrast IMPRESSION: No acute findings within the abdomen or pelvis. Mildly enlarged prostate.  GI procedures: 11/20/2022 colonoscopy with Dr. Albertus - The examined portion of the ileum was normal. - Proctosigmoid ulcerative colitis. Inflammation was found from the rectum to the sigmoid colon. This was mild in severity to moderate in severity. Biopsied. - Normal mucosa from cecum to descending colon. Biopsied.- Small internal hemorrhoids.  11/11/2022 EGD - Normal esophagus. - Erythematous mucosa in the cardia  and gastric fundus. - Normal examined duodenum. - Biopsies were taken with a cold forceps for histology and Helicobacter pylori testing. Path: Surgical [P], gastric bxs - ANTRAL AND OXYNTIC MUCOSA WITH MILD CHRONIC INACTIVE GASTRITIS. - NO HELICOBACTER PYLORI ORGANISMS IDENTIFIED ON H&E STAINED SLIDE.  Assessment: Encounter Diagnoses  Name Primary?   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  No known triggers.  At one point it was thought his symptoms [place] Erbes be related to NSAID use therefore patient has remained off of all NSAID's.  Patient was recently started on Entyvio  in addition to higher dose of Lialda  4.8 g daily.  Patient did receive 23-month course of budesonide  back in April, however patient states he only took 1 month due to side effect of insomnia.  Patient had CT scan about 2 weeks ago that was completely normal.  Normal CRP.  Stable hemoglobin 13.3.  He is pending fecal calprotectin.  If elevated Nolton Denis consider another round of budesonide .  However he did speak with Dr. Nandigam and she recommended trialing IBgard and Gas-X scheduled.  Patient states that Levsin  has helped in the past however has called urinary retention.  Also recommended he take the Nexium  scheduled versus as needed for history of gastritis.    Plan: -fecal calprotectin pending, we discussed pending results would determine if he needed Budesonide  - OTC Gas-X 1 to 2 capsules 3 times daily with meals  - OTC IBgard 1 capsule 3 times daily with meals for possible abdominal spasm/IBS abdominal pain.   - Continue daily Nexium  40 mg before breakfast scheduled -GERD diet, no late meals 3-4 hours before lying down  Thank you for the courtesy of this consult. Please call me with any questions or concerns.   Caprisha Bridgett, FNP-C [facility] 07/14/2024, 3:29 PM  Cc: Avva, Ravisankar, MD

## 2024-07-14 NOTE — Patient Instructions (Addendum)
 Recommend VSL #3 Online - take 1 tablet po daily   We are awaiting fecal calprotectin results, someone will contact you from office once results come in.   Per Dr. Trenna recommendations: -Take OTC Gas-X 1 to 2 capsules 3 times daily with meals  - Take OTC IBgard 1 capsule 3 times daily with meals for possible abdominal spasm/IBS abdominal pain.   - Continue daily Nexium  40 mg before breakfast -Follow GERD diet  _______________________________________________________  If your blood pressure at your visit was 140/90 or greater, please contact your primary care physician to follow up on this.  _______________________________________________________  If you are age 1 or older, your body mass index should be between 23-30. Your Body mass index is 21.15 kg/m. If this is out of the aforementioned range listed, please consider follow up with your Primary Care Provider.  If you are age 15 or younger, your body mass index should be between 19-25. Your Body mass index is 21.15 kg/m. If this is out of the aformentioned range listed, please consider follow up with your Primary Care Provider.   ________________________________________________________  The [facility] providers would like to encourage you to use MYCHART to communicate with providers for non-urgent requests or questions.  Due to long hold times on the telephone, sending your provider a message by [facility] may be a faster and more efficient way to get a response.  Please allow 48 business hours for a response.  Please remember that this is for non-urgent requests.  _______________________________________________________  [facility] is using a team-based approach to care.  Your team is made up of your doctor and two to three APPS. Our APPS (Nurse Practitioners and Physician Assistants) work with your physician to ensure care continuity for you. They are fully qualified to address your health concerns and develop a  treatment plan. They communicate directly with your gastroenterologist to care for you. Seeing the Advanced Practice Practitioners on your physician's team can help you by facilitating care more promptly, often allowing for earlier appointments, access to diagnostic testing, procedures, and other specialty referrals.   Thank you for trusting me with your gastrointestinal care. Deanna May, NP-C

## 2024-07-14 NOTE — Telephone Encounter (Signed)
 Called back, he is experiencing intermittent severe epigastric left upper quadrant and lower abdominal pain.  History of left-sided ulcerative colitis currently on mesalamine  and Entyvio .  Denies any rectal bleeding or change in bowel habits.  Reviewed recent CT and labs.  Fecal calprotectin pending Advised trial of Gas-X [simethicone ] 1 to 2 capsule 3 times daily with meals and IBgard 1 capsule 3 times daily with meals for possible abdominal spasms/IBS exacerbating abdominal pain Continue daily Nexium  40 mg before breakfast He had urinary retention with Levsin , advised him to avoid Levsin 

## 2024-07-14 NOTE — Progress Notes (Signed)
 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 MRN: [id number] PCP: Janey Santos, MD   Patient Active Problem List   Diagnosis Date Noted   Rhinitis, non-allergic 04/12/2024   Snoring 04/12/2024   Non-restorative sleep 04/12/2024   Nasal septal deviation 04/12/2024   Muscle mass 04/12/2024   Excessive daytime sleepiness 04/12/2024   History of nocturia 04/12/2024   Pain in joint of left knee 05/26/2022   Neck pain 10/22/2021   Elevated prostate specific antigen (PSA) 10/08/2021   Lumbar pain 06/19/2021   Pain of left hip joint 04/27/2020   Shortness of breath 05/05/2018   Abnormal stress ECG with treadmill 05/05/2018   Hypercholesteremia    Left sided colitis (HCC)      Subjective:  Corey Sor, 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 Nicholaus with subsequent Foley catheter(4-5 days) placement secondary to retention which had been removed.  Subsequent developed dysuria and was found to have ESBL E. coli growing on urine cultures on 6/28(pt states no symtomss).  Treated with fosfomycin x 1 dose then communicated with urology.  Patient on 7/22 noted to have dysuria, frequency, suprapubic pressure, perform self cath to collect urine samples noted to be leukocyte and nitrite positive and given another 3 doses of fosfomycin every 3 days. Today 07/14/24:  -6/28 pt went to ED for urinary retention, no dysuria->esbl ecoli, rx bactrim   -Pt had syrua, increased urinary frequency started on the 17th-> fosfomycin on Monday. Symotms improved - 3 days laster symotms re-occurred->given 3 more doses of fosfomycin with  urology-> last dose on 7/29 he notes mild abdomianl pain, epigarstric sometimes cramping, thinks associated with fosfomycin. Slightly loose stool. Last BM  Tuesday , which was formed.  No dysuria today, no suprapubic tenderness today. He is concerned his symptoms may return.  Review of Systems  All other systems reviewed and are negative.   Past Medical History:  Diagnosis Date   Arthritis 03/2020   R hip   Asthma    Atherosclerotic heart disease of native coronary artery without angina pectoris    Atrial fibrillation (HCC)    DDD (degenerative disc disease), lumbar    Diabetes insipidus (HCC)    Elevated PSA    GERD (gastroesophageal reflux disease)    History of adenomatous polyp of colon    HLD (hyperlipidemia)    Hypercholesteremia    Insomnia    Internal hemorrhoids    Intracranial arachnoid cyst    Monoallelic mutation of AVP gene    Other chronic sinusitis    Other fatigue    PSA elevation    Sleep apnea    Snoring    Ulcerative colitis    Ulcerative colitis (HCC)     Outpatient Medications Prior to Visit  Medication Sig Dispense Refill   acetaminophen  (TYLENOL ) 500 MG tablet Take 500-1,000 mg by mouth as needed.     albuterol  (VENTOLIN  HFA) 108 (90 Base) MCG/ACT inhaler Inhale 2 puffs into the lungs every 4 (four) hours as needed. 18 g 3   azelastine  (ASTELIN ) 0.1 % nasal spray Place 1 spray into both nostrils 2 (two) times daily. 30 mL 2   Cholecalciferol (VITAMIN D3) 125 MCG (5000 UT) TABS Take 5,000 tablets by mouth. Twice weekly.     desmopressin  (DDAVP ) 0.2 MG tablet Take 1 tablet (0.2 mg total)  by mouth 2 (two) times daily as directed 60 tablet 12   diclofenac  Sodium (VOLTAREN ) 1 % GEL Apply 2 g topically to the affected area(s) 4 (four) times daily. 100 g 5   famotidine (PEPCID) 20 MG tablet Take 20 mg by mouth daily as needed.     fexofenadine (ALLEGRA) 180 MG tablet Take 180 mg by mouth.     Fluticasone  Propionate (XHANCE) 93 MCG/ACT EXHU Place 2 sprays into the nose 2 times daily at 12 noon and 4 pm.     fosfomycin (MONUROL) 3 g PACK Take by mouth.     hyoscyamine  (LEVSIN  SL) 0.125 MG SL tablet Take 1 by  mouth every 6-8 hours as needed 30 tablet 2   ipratropium (ATROVENT ) 0.06 % nasal spray Place 2 sprays into both nostrils 3 (three) times daily. 15 mL 5   mesalamine  (LIALDA ) 1.2 g EC tablet Take 2 tablets (2.4 g total) by mouth in the morning and at bedtime. 360 tablet 1   methocarbamol  (ROBAXIN ) 500 MG tablet Take 1 tablet (500 mg total) by mouth every 8 (eight) hours. 90 tablet 1   metoprolol  tartrate (LOPRESSOR ) 25 MG tablet Take 0.5 tablets (12.5 mg total) by mouth 2 (two) times daily as needed. (Patient not taking: Reported on 07/12/2024) 90 tablet 3   rosuvastatin  (CRESTOR ) 20 MG tablet Take 1 tablet (20 mg total) by mouth daily. 90 tablet 4   tamsulosin  (FLOMAX ) 0.4 MG CAPS capsule Take 1 capsule (0.4 mg total) by mouth daily. 20 capsule 0   Vedolizumab  (ENTYVIO  IV) Inject into the vein.     Zolpidem  Tartrate 1.75 MG SUBL Place 1 tablet under the tongue once a night. (Patient not taking: Reported on 07/12/2024) 10 tablet 0   No facility-administered medications prior to visit.     Allergies  Allergen Reactions   Amoxicillin-Pot Clavulanate Diarrhea and Nausea Only    Other Reaction(s): GI Intolerance   Cephalexin     Other Reaction(s): other   Doxycycline Diarrhea and Nausea Only    Other Reaction(s): GI Intolerance  doxycycline   Levofloxacin      Other Reaction(s): other   Metronidazole     Other Reaction(s): other   Prednisone      Other Reaction(s): other  Gastritis.   prednisone     Social History   Tobacco Use   Smoking status: Never   Smokeless tobacco: Never  Vaping Use   Vaping status: Never Used  Substance Use Topics   Alcohol  use: No   Drug use: No    Family History  Problem Relation Age of Onset   Heart disease Father    Colon cancer Neg Hx    Esophageal cancer Neg Hx    Stomach cancer Neg Hx    Rectal cancer Neg Hx     Objective:  There were no vitals filed for this visit. There is no height or weight on file to calculate BMI.  Physical  Exam Constitutional:      General: He is not in acute distress.    Appearance: He is normal weight. He is not toxic-appearing.  HENT:     Head: Normocephalic and atraumatic.     Right Ear: External ear normal.     Left Ear: External ear normal.     Nose: No congestion or rhinorrhea.     Mouth/Throat:     Mouth: Mucous membranes are moist.     Pharynx: Oropharynx is clear.  Eyes:     Extraocular Movements: Extraocular movements intact.  Conjunctiva/sclera: Conjunctivae normal.     Pupils: Pupils are equal, round, and reactive to light.  Cardiovascular:     Rate and Rhythm: Normal rate and regular rhythm.     Heart sounds: No murmur heard.    No friction rub. No gallop.  Pulmonary:     Effort: Pulmonary effort is normal.     Breath sounds: Normal breath sounds.  Abdominal:     General: Abdomen is flat. Bowel sounds are normal.     Palpations: Abdomen is soft.  Musculoskeletal:        General: No swelling. Normal range of motion.     Cervical back: Normal range of motion and neck supple.  Skin:    General: Skin is warm and dry.  Neurological:     General: No focal deficit present.     Mental Status: He is oriented to person, place, and time.  Psychiatric:        Mood and Affect: Mood normal.     Lab Results: Lab Results  Component Value Date   WBC 9.6 07/13/2024   HGB 13.3 07/13/2024   HCT 39.1 07/13/2024   MCV 91.6 07/13/2024   PLT 268.0 07/13/2024    Lab Results  Component Value Date   CREATININE 0.67 07/13/2024   BUN 17 07/13/2024   NA 136 07/13/2024   K 3.7 07/13/2024   CL 100 07/13/2024   CO2 30 07/13/2024    Lab Results  Component Value Date   ALT 13 07/13/2024   AST 12 07/13/2024   ALKPHOS 77 07/13/2024   BILITOT 1.4 (H) 07/13/2024     Assessment & Plan:  68 year old male with  ulcerative colitis/gerd/epigastric discomfort followed by GI, djd, followed by neurology for EDS, HLD, palpitations followed  by cardiolgist who underwent prostate  biopsy presents for urine cultures that grew ESBL E. Coli #Esbl ecoli + rune Cx #Hx of urine retention - Following biopsy on 6/24 he had catheter which was eventually removed for urinary retention. Biopsy was benign tissue, chronic feature c/w atrophy, one biopsy noted small acinar proliferation. No prostatitis was noted.  - Patient then developed dysuria with urine cultures growing ESBL E. coli, urology was contacted after dose of fosfomycin.  He was having dysuria, pelvic pressure, frequency after dose of fosfomycin 7/28 urine cultures obtained again.  Given fosfomycin x 3 days every 3 days.  Referred to infectious disease. - Given that there is urinary retention I think ESBL could also be a colonizer.  Unless he is having symptoms would not continue to treat(pt notes he has strip at home to test urine).   E. coli is sensitive to Macrobid  as such recommend x 10 days Macrobid  while waiting for urine cultures with next episode.  Continue to follow with urology.  Follow-up with the infectious disease in a month. Pt asymptomatic today.  -If symptoms do not resolve with macrobid  or re-occur sooner(within the next few days) then will consider on picc and ertapenem  x 2 weeks.   #Multiple antibiotics allergies noted #Known GERD with epigastric discomfort, followed by GI -Pt noted some abdominal cramping with fosfomycin, tolerable but bothersome. He is on probiotics -On chart review multiple abx allergy: augmentin, metro, levaquin , keflex, doxy. Diarrhea and nauseas with augmentin and doxy->which is more consistent with gi intolerance than allergy -I had noted that he will likely have gi intolerance with a broad range of abx given his history, he is wiling to try macrobid .   Loney Stank, MD [facility]   07/14/24  5:12 AM I have personally spent 101 minutes involved in face-to-face and non-face-to-face activities for this patient on the day of the  visit. Professional time spent includes the following activities: Preparing to see the patient (review of tests), Obtaining and/or reviewing separately obtained history (admission/discharge record), Performing a medically appropriate examination and/or evaluation , Ordering medications/tests/procedures, referring and communicating with other health care professionals, Documenting clinical information in the EMR, Independently interpreting results (not separately reported), Communicating results to the patient/family/caregiver, Counseling and educating the patient/family/caregiver and Care coordination (not separately reported).

## 2024-07-15 LAB — CALPROTECTIN, FECAL: Calprotectin, Fecal: 19 ug/g (ref 0–120)

## 2024-07-15 NOTE — Addendum Note (Signed)
 Addended by: KNUTE REENA DEL on: 07/15/2024 08:06 AM   Modules accepted: Orders

## 2024-07-17 ENCOUNTER — Encounter: Payer: Self-pay | Admitting: Internal Medicine

## 2024-07-18 ENCOUNTER — Telehealth: Payer: Self-pay | Admitting: Internal Medicine

## 2024-07-18 ENCOUNTER — Emergency Department (HOSPITAL_COMMUNITY)
Admission: EM | Admit: 2024-07-18 | Discharge: 2024-07-18 | Disposition: A | Discharge status: Home / Self Care | Source: Ambulatory Visit | Attending: Emergency Medicine | Admitting: Emergency Medicine

## 2024-07-18 ENCOUNTER — Encounter (HOSPITAL_COMMUNITY): Payer: Self-pay

## 2024-07-18 ENCOUNTER — Encounter: Payer: Self-pay | Admitting: Internal Medicine

## 2024-07-18 ENCOUNTER — Emergency Department (HOSPITAL_COMMUNITY)

## 2024-07-18 ENCOUNTER — Other Ambulatory Visit: Payer: Self-pay

## 2024-07-18 DIAGNOSIS — Z95828 Presence of other vascular implants and grafts: Secondary | ICD-10-CM | POA: Insufficient documentation

## 2024-07-18 DIAGNOSIS — E119 Type 2 diabetes mellitus without complications: Secondary | ICD-10-CM | POA: Insufficient documentation

## 2024-07-18 DIAGNOSIS — N419 Inflammatory disease of prostate, unspecified: Secondary | ICD-10-CM | POA: Diagnosis not present

## 2024-07-18 DIAGNOSIS — Z452 Encounter for adjustment and management of vascular access device: Secondary | ICD-10-CM | POA: Diagnosis not present

## 2024-07-18 DIAGNOSIS — T82898A Other specified complication of vascular prosthetic devices, implants and grafts, initial encounter: Secondary | ICD-10-CM | POA: Diagnosis not present

## 2024-07-18 DIAGNOSIS — N390 Urinary tract infection, site not specified: Secondary | ICD-10-CM

## 2024-07-18 DIAGNOSIS — R1030 Lower abdominal pain, unspecified: Secondary | ICD-10-CM | POA: Diagnosis not present

## 2024-07-18 LAB — BASIC METABOLIC PANEL WITH GFR
Anion gap: 11 (ref 5–15)
BUN: 12 mg/dL (ref 8–23)
CO2: 24 mmol/L (ref 22–32)
Calcium: 9.4 mg/dL (ref 8.9–10.3)
Chloride: 103 mmol/L (ref 98–111)
Creatinine, Ser: 0.69 mg/dL (ref 0.61–1.24)
GFR, Estimated: >60 mL/min (ref >60)
Glucose, Bld: 108 mg/dL — ABNORMAL HIGH (ref 70–99)
Potassium: 3.4 mmol/L — ABNORMAL LOW (ref 3.5–5.1)
Sodium: 138 mmol/L (ref 135–145)

## 2024-07-18 LAB — CBC
HCT: 42.6 % (ref 39.0–52.0)
Hemoglobin: 13.9 g/dL (ref 13.0–17.0)
MCH: 31 pg (ref 26.0–34.0)
MCHC: 32.6 g/dL (ref 30.0–36.0)
MCV: 95.1 fL (ref 80.0–100.0)
Platelets: 223 K/uL (ref 150–400)
RBC: 4.48 MIL/uL (ref 4.22–5.81)
RDW: 13.2 % (ref 11.5–15.5)
WBC: 7.9 K/uL (ref 4.0–10.5)
nRBC: 0 % (ref 0.0–0.2)

## 2024-07-18 LAB — URINALYSIS, ROUTINE W REFLEX MICROSCOPIC
Bacteria, UA: NONE SEEN
Bilirubin Urine: NEGATIVE
Glucose, UA: NEGATIVE mg/dL
Hgb urine dipstick: NEGATIVE
Ketones, ur: NEGATIVE mg/dL
Leukocytes,Ua: NEGATIVE
Nitrite: POSITIVE — AB
Protein, ur: NEGATIVE mg/dL
Specific Gravity, Urine: 1.004 — ABNORMAL LOW (ref 1.005–1.030)
pH: 6 (ref 5.0–8.0)

## 2024-07-18 MED ORDER — SODIUM CHLORIDE 0.9% FLUSH
10.0000 mL | Freq: Two times a day (BID) | INTRAVENOUS | Status: DC
  Administered 2024-07-18: 10 mL

## 2024-07-18 MED ORDER — SODIUM CHLORIDE 0.9 % IV SOLN
1.0000 g | Freq: Once | INTRAVENOUS | Status: AC
  Administered 2024-07-18: 1 g via INTRAVENOUS
  Filled 2024-07-18: qty 1000

## 2024-07-18 MED ORDER — CHLORHEXIDINE GLUCONATE CLOTH 2 % EX PADS: 6.0000 | MEDICATED_PAD | Freq: Every day | CUTANEOUS | Status: DC

## 2024-07-18 MED ORDER — ACETAMINOPHEN 500 MG PO TABS
1000.0000 mg | ORAL_TABLET | Freq: Once | ORAL | Status: DC
  Filled 2024-07-18: qty 2

## 2024-07-18 MED ORDER — SODIUM CHLORIDE 0.9% FLUSH: 10.0000 mL | INTRAVENOUS | Status: DC | PRN

## 2024-07-18 MED ORDER — HEPARIN SOD (PORK) LOCK FLUSH 100 UNIT/ML IV SOLN
250.0000 [IU] | INTRAVENOUS | Status: AC | PRN
  Administered 2024-07-18: 250 [IU]

## 2024-07-18 MED ORDER — OXYCODONE HCL 5 MG PO TABS
5.0000 mg | ORAL_TABLET | Freq: Once | ORAL | Status: DC
  Filled 2024-07-18: qty 1

## 2024-07-18 NOTE — ED Triage Notes (Signed)
 Pt came in for pain in his prostate area. Pt stated he's been taking antibiotics by mouth but the pain is getting worse. Pt's infectious disease specialist wanted him to get a PICC line inserted so he can receive antibiotics for 14 days. Pt stated he is urinating every 30 minutes as well.

## 2024-07-18 NOTE — Telephone Encounter (Signed)
 Thank you - listing end date as 8/22 given 2 weeks from PICC placement on 07/22/24.

## 2024-07-18 NOTE — ED Notes (Signed)
 ED Provider at bedside.

## 2024-07-18 NOTE — ED Notes (Signed)
 Pt requested if we could do a PICC line instead of PIV. Provider notified.

## 2024-07-18 NOTE — Telephone Encounter (Signed)
 Called and speak to patient. He notes that he would like to try the macrobid  first (symptoms significantly improved) and if it re-occurs then consider IV abx.   I noted that appropriate treatment for prostatitis/  complicated UTI would be IV options at this point. Given his frequent symptoms following biopsy would go ahead and treat as complicated uti/prostatitis.

## 2024-07-18 NOTE — Discharge Instructions (Signed)
 Please follow up with Dr. Dennise tomorrow about how to obtain IV antibiotics at home. Please watch for signs of infection.

## 2024-07-18 NOTE — Telephone Encounter (Signed)
 Per Holley Herring, RN with [facility], patient will have [facility] and will be able to have first dose administered in home.   Latonga Ponder, BSN, RN

## 2024-07-18 NOTE — Telephone Encounter (Signed)
 Provider placed OPAT note.   Jashon Ishida, BSN, RN

## 2024-07-18 NOTE — Progress Notes (Signed)
 PICC RN reviewed pt chart and current PICC list and is in route to [facility]. ED nurse notified and provided instructions for how to prepare pt environment.

## 2024-07-18 NOTE — Progress Notes (Signed)
 Peripherally Inserted Central Catheter Placement  The IV Nurse has discussed with the patient and/or persons authorized to consent for the patient, the purpose of this procedure and the potential benefits and risks involved with this procedure.  The benefits include less needle sticks, lab draws from the catheter, and the patient may be discharged home with the catheter. Risks include, but not limited to, infection, bleeding, blood clot (thrombus formation), and puncture of an artery; nerve damage and irregular heartbeat and possibility to perform a PICC exchange if needed/ordered by physician.  Alternatives to this procedure were also discussed.  Bard Power PICC patient education guide, fact sheet on infection prevention and patient information card has been provided to patient /or left at bedside.    PICC Placement Documentation  PICC Single Lumen 07/18/24 Right Basilic 39 cm 0 cm (Active)  Indication for Insertion or Continuance of Line Home intravenous therapies (PICC only) 07/18/24 2230  Exposed Catheter (cm) 0 cm 07/18/24 2230  Site Assessment Clean, Dry, Intact 07/18/24 2230  Line Status Blood return noted;Flushed;Saline locked 07/18/24 2230  Dressing Type Transparent;Securing device 07/18/24 2230  Dressing Status Antimicrobial disc/dressing in place;Clean, Dry, Intact 07/18/24 2230  Line Care Connections checked and tightened 07/18/24 2230  Line Adjustment (NICU/IV Team Only) No 07/18/24 2230  Dressing Intervention New dressing;Adhesive placed at insertion site (IV team only) 07/18/24 2230  Dressing Change Due 07/25/24 07/18/24 2230       Debby Salomon CROME 07/18/2024, 10:48 PM

## 2024-07-18 NOTE — Progress Notes (Signed)
 Responded to consult for nurse question. 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 Reached out to notify PICC RN via secure chat.

## 2024-07-18 NOTE — Telephone Encounter (Signed)
 Forwarding note to pharmacy as well

## 2024-07-18 NOTE — ED Provider Notes (Signed)
 [place] EMERGENCY DEPARTMENT AT [facility] Provider Note   CSN: [id number] Arrival date & time: 07/18/24  1715     History {Add pertinent medical, surgical, social history, OB history to HPI:1} Chief Complaint  Patient presents with  . 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 Pt stated he's been taking antibiotics by mouth but the pain is getting worse. Pt's infectious disease specialist wanted him to get a PICC line inserted so he can receive antibiotics for 14 days. Pt stated he is urinating every 30 minutes as well.  Per chart review, patient's infectious disease MD Dr. Dennise recommended PICC line and ertapenem  x 2 weeks.    Past Medical History:  Diagnosis Date  . Arthritis 03/2020   R hip  . Asthma   . Atherosclerotic heart disease of native coronary artery without angina pectoris   . Atrial fibrillation (HCC)   . DDD (degenerative disc disease), lumbar   . Diabetes insipidus (HCC)   . Elevated PSA   . GERD (gastroesophageal reflux disease)   . History of adenomatous polyp of colon   . HLD (hyperlipidemia)   . Hypercholesteremia   . Insomnia   . Internal hemorrhoids   . Intracranial arachnoid cyst   . Monoallelic mutation of AVP gene   . Other chronic sinusitis   . Other fatigue   . PSA elevation   . Sleep apnea   . Snoring   . Ulcerative colitis   . Ulcerative colitis (HCC)        Home Medications Prior to Admission medications   Medication Sig Start Date End Date Taking? Authorizing Provider  acetaminophen  (TYLENOL ) 500 MG tablet Take 500-1,000 mg by mouth as needed.    [provider]  azelastine  (ASTELIN ) 0.1 % nasal spray Place 1 spray into both nostrils 2 (two) times daily. 12/25/22   Jesus Oliphant, MD  Cholecalciferol (VITAMIN D3) 125 MCG (5000 UT) TABS Take 5,000 tablets by mouth. Twice weekly.    [provider]  desmopressin  (DDAVP ) 0.2 MG  tablet Take 1 tablet (0.2 mg total) by mouth 2 (two) times daily as directed 12/09/22     diclofenac  Sodium (VOLTAREN ) 1 % GEL Apply 2 g topically to the affected area(s) 4 (four) times daily. 07/20/23     famotidine (PEPCID) 20 MG tablet Take 20 mg by mouth daily as needed.    [provider]  fexofenadine (ALLEGRA) 180 MG tablet Take 180 mg by mouth. 07/02/10   [provider]  Fluticasone  Propionate (XHANCE) 93 MCG/ACT EXHU Place 2 sprays into the nose 2 times daily at 12 noon and 4 pm.    [provider]  hyoscyamine  (LEVSIN  SL) 0.125 MG SL tablet Take 1 by mouth every 6-8 hours as needed 12/14/23   Pyrtle, Gordy HERO, MD  ipratropium (ATROVENT ) 0.06 % nasal spray Place 2 sprays into both nostrils 3 (three) times daily. 07/29/23     mesalamine  (LIALDA ) 1.2 g EC tablet Take 2 tablets (2.4 g total) by mouth in the morning and at bedtime. 03/30/24   Craig Alan SAUNDERS, PA-C  methocarbamol  (ROBAXIN ) 500 MG tablet Take 1 tablet (500 mg total) by mouth every 8 (eight) hours. 08/26/22     metoprolol  tartrate (LOPRESSOR ) 25 MG tablet Take 0.5 tablets (12.5 mg total) by mouth 2 (two) times daily as needed. Patient not taking: Reported on 07/14/2024 05/23/24 08/21/24  Ladona Heinz, MD  nitrofurantoin , macrocrystal-monohydrate, (MACROBID ) 100 MG capsule Take 1 capsule (100 mg total) by mouth 2 (two) times daily for 10 days. 07/14/24 07/24/24  Dennise Kingsley, MD  rosuvastatin  (CRESTOR ) 20 MG tablet Take 1 tablet (20 mg total) by mouth daily. 07/02/23     tamsulosin  (FLOMAX ) 0.4 MG CAPS capsule Take 1 capsule (0.4 mg total) by mouth daily. 06/11/24   Lenor Hollering, MD  Vedolizumab  (ENTYVIO  IV) Inject into the vein.    [provider]  Zolpidem  Tartrate 1.75 MG SUBL Place 1 tablet under the tongue once a night. Patient not taking: Reported on 07/14/2024 10/16/22         Allergies    Amoxicillin-pot clavulanate, Cephalexin, Doxycycline, Levofloxacin , Metronidazole, and Prednisone     Review of  Systems   Review of Systems A 10 point review of systems was performed and is negative unless otherwise reported in HPI.  Physical Exam Updated Vital Signs BP (!) 160/93   Pulse 80   Temp 98.2 F (36.8 C) (Oral)   Resp 16   SpO2 99%  Physical Exam General: Normal appearing {Desc; male/male:11659}, lying in bed.  HEENT: PERRLA, Sclera anicteric, MMM, trachea midline.  Cardiology: RRR, no murmurs/rubs/gallops. BL radial and DP pulses equal bilaterally.  Resp: Normal respiratory rate and effort. CTAB, no wheezes, rhonchi, crackles.  Abd: Soft, non-tender, non-distended. No rebound tenderness or guarding.  GU: Deferred. MSK: No peripheral edema or signs of trauma. Extremities without deformity or TTP. No cyanosis or clubbing. Skin: warm, dry. No rashes or lesions. Back: No CVA tenderness Neuro: A&Ox4, CNs II-XII grossly intact. MAEs. Sensation grossly intact.  Psych: Normal mood and affect.   ED Results / Procedures / Treatments   Labs (all labs ordered are listed, but only abnormal results are displayed) Labs Reviewed  BASIC METABOLIC PANEL WITH GFR - Abnormal; Notable for the following components:      Result Value   Potassium 3.4 (*)    Glucose, Bld 108 (*)    All other components within normal limits  URINALYSIS, ROUTINE W REFLEX MICROSCOPIC - Abnormal; Notable for the following components:   Color, Urine AMBER (*)    Specific Gravity, Urine 1.004 (*)    Nitrite POSITIVE (*)    All other components within normal limits  CBC    EKG None  Radiology No results found.  Procedures Procedures  {Document cardiac monitor, telemetry assessment procedure when appropriate:1}  Medications Ordered in ED Medications - No data to display  ED Course/ Medical Decision Making/ A&P                          Medical Decision Making Amount and/or Complexity of Data Reviewed Labs: ordered.    This patient presents to the ED for concern of ***, this involves an extensive  number of treatment options, and is a complaint that carries with it a high risk of complications and morbidity.  I considered the following differential and admission for this acute, potentially life threatening condition.   MDM:    ***     Labs: I Ordered, and personally interpreted labs.  The pertinent results include:  ***  Imaging Studies ordered: I ordered imaging studies including *** I independently visualized and interpreted imaging. I agree with the radiologist interpretation  Additional history obtained from ***.  External records from outside source obtained and reviewed including ***  Cardiac Monitoring: .The patient was maintained on a cardiac monitor.  I personally  viewed and interpreted the cardiac monitored which showed an underlying rhythm of: ***  Reevaluation: After the interventions noted above, I reevaluated the patient and found that they have :{resolved/improved/worsened:23923::improved}  Social Determinants of Health: .***  Disposition:  ***  Co morbidities that complicate the patient evaluation . Past Medical History:  Diagnosis Date  . Arthritis 03/2020   R hip  . Asthma   . Atherosclerotic heart disease of native coronary artery without angina pectoris   . Atrial fibrillation (HCC)   . DDD (degenerative disc disease), lumbar   . Diabetes insipidus (HCC)   . Elevated PSA   . GERD (gastroesophageal reflux disease)   . History of adenomatous polyp of colon   . HLD (hyperlipidemia)   . Hypercholesteremia   . Insomnia   . Internal hemorrhoids   . Intracranial arachnoid cyst   . Monoallelic mutation of AVP gene   . Other chronic sinusitis   . Other fatigue   . PSA elevation   . Sleep apnea   . Snoring   . Ulcerative colitis   . Ulcerative colitis (HCC)      Medicines No orders of the defined types were placed in this encounter.   I have reviewed the patients home medicines and have made adjustments as needed  Problem List / ED  Course: Problem List Items Addressed This Visit   None        {Document critical care time when appropriate:1} {Document review of labs and clinical decision tools ie heart score, Chads2Vasc2 etc:1}  {Document your independent review of radiology images, and any outside records:1} {Document your discussion with family members, caretakers, and with consultants:1} {Document social determinants of health affecting pt's care:1} {Document your decision making why or why not admission, treatments were needed:1}  This note was created using dictation software, which may contain spelling or grammatical errors.

## 2024-07-18 NOTE — Telephone Encounter (Signed)
 Patient advised by Dr. Dennise to go to [facility] ED for picc line placement and start of IV abx. Patient verbalized understanding.  Per Dr. Dennise she will also reach out to [facility] to give instructions for the patient.  [name], CMA

## 2024-07-18 NOTE — Telephone Encounter (Signed)
 Attempted to call pt in regards to plan, left voicemail to call clinic. The plan is noted below. Ok to continue macrobid  while awaiting picc placement.   Since symptoms returned in a short period of time. Plan to place picc and ertapenem  x 2 weeks.   OPAT ORDERS:  Diagnosis: recurrent uti/prostatitis  Culture Result: esbl ecoli  Allergies  Allergen Reactions   Amoxicillin-Pot Clavulanate Diarrhea and Nausea Only    Other Reaction(s): GI Intolerance   Cephalexin     Other Reaction(s): other   Doxycycline Diarrhea and Nausea Only    Other Reaction(s): GI Intolerance  doxycycline   Levofloxacin      Other Reaction(s): other   Metronidazole     Other Reaction(s): other   Prednisone      Other Reaction(s): other  Gastritis.   prednisone      Discharge antibiotics to be given via PICC line:  Per pharmacy protocol ertapenem  1gm q24h   Duration: 2 weeks End Date: 8/21  [facility] Care Per Protocol with [employer] Use: Home health RN for IV administration and teaching, line care and labs.    Labs weekly while on IV antibiotics: _x_ CBC with differential __ BMP **TWICE WEEKLY ON VANCOMYCIN  x__ CMP __ CRP __ ESR __ Vancomycin trough TWICE WEEKLY __ CK  _x_ Please pull PIC at completion of IV antibiotics __ Please leave PIC in place until doctor has seen patient or been notified  Fax weekly labs to [phone]  Clinic Follow Up Appt: 8/28  @ [facility] with Jaterrius Ricketson

## 2024-07-18 NOTE — Telephone Encounter (Signed)
 Message sent to Holley Herring, RN with [facility], notifying her that patient was going to ED to see if they could place PICC and start IV Abx. Patient was hoping [name] services could be moved up from Friday to tomorrow (8/5).  Easton Sivertson, BSN, RN

## 2024-07-18 NOTE — Telephone Encounter (Signed)
 Provider spoke with patient, he would prefer to trial oral Macrobid  before proceeding with IV options.   Jossue Rubenstein, BSN, RN

## 2024-07-18 NOTE — Telephone Encounter (Signed)
 Patient called, reports that he is having a lot of urgency as well as suprapubic and prostate pressure.   He states he doesn't think that he'll survive until PICC placement on Friday.   Discussed that IR does not have any sooner availability and that if his symptoms were severe he would need to go to the emergency department.   He would like to receive infusions at the [facility] or have fosfomycin sent in the meantime.   Georganne Siple, BSN, RN

## 2024-07-18 NOTE — Telephone Encounter (Signed)
 Patient called back, he has changed his mind and would like to proceed with PICC and IV Abx. Provider notified.   Relayed PICC appt time and location to patient.  Scheduled for 8/8 at 8:45 AM at [facility].   Orders sent to Holley Herring, RN with [facility].  Waiting to hear if first dose can be done in home.   Adebayo Ensminger, BSN, RN

## 2024-07-19 ENCOUNTER — Encounter: Payer: Self-pay | Admitting: Internal Medicine

## 2024-07-19 ENCOUNTER — Other Ambulatory Visit: Payer: Self-pay

## 2024-07-19 DIAGNOSIS — H547 Unspecified visual loss: Secondary | ICD-10-CM | POA: Diagnosis not present

## 2024-07-19 DIAGNOSIS — G4730 Sleep apnea, unspecified: Secondary | ICD-10-CM | POA: Diagnosis not present

## 2024-07-19 DIAGNOSIS — M109 Gout, unspecified: Secondary | ICD-10-CM | POA: Diagnosis not present

## 2024-07-19 DIAGNOSIS — E7800 Pure hypercholesterolemia, unspecified: Secondary | ICD-10-CM | POA: Diagnosis not present

## 2024-07-19 DIAGNOSIS — B9620 Unspecified Escherichia coli [E. coli] as the cause of diseases classified elsewhere: Secondary | ICD-10-CM | POA: Diagnosis not present

## 2024-07-19 DIAGNOSIS — K219 Gastro-esophageal reflux disease without esophagitis: Secondary | ICD-10-CM | POA: Diagnosis not present

## 2024-07-19 DIAGNOSIS — Z79899 Other long term (current) drug therapy: Secondary | ICD-10-CM | POA: Diagnosis not present

## 2024-07-19 DIAGNOSIS — N419 Inflammatory disease of prostate, unspecified: Secondary | ICD-10-CM | POA: Diagnosis not present

## 2024-07-19 DIAGNOSIS — M51369 Other intervertebral disc degeneration, lumbar region without mention of lumbar back pain or lower extremity pain: Secondary | ICD-10-CM | POA: Diagnosis not present

## 2024-07-19 DIAGNOSIS — N411 Chronic prostatitis: Secondary | ICD-10-CM | POA: Diagnosis not present

## 2024-07-19 DIAGNOSIS — I2510 Atherosclerotic heart disease of native coronary artery without angina pectoris: Secondary | ICD-10-CM | POA: Diagnosis not present

## 2024-07-19 DIAGNOSIS — N390 Urinary tract infection, site not specified: Secondary | ICD-10-CM | POA: Diagnosis not present

## 2024-07-19 DIAGNOSIS — Z860101 Personal history of adenomatous and serrated colon polyps: Secondary | ICD-10-CM | POA: Diagnosis not present

## 2024-07-19 DIAGNOSIS — I4891 Unspecified atrial fibrillation: Secondary | ICD-10-CM | POA: Diagnosis not present

## 2024-07-19 DIAGNOSIS — Z9181 History of falling: Secondary | ICD-10-CM | POA: Diagnosis not present

## 2024-07-19 DIAGNOSIS — Z452 Encounter for adjustment and management of vascular access device: Secondary | ICD-10-CM | POA: Diagnosis not present

## 2024-07-19 DIAGNOSIS — M4692 Unspecified inflammatory spondylopathy, cervical region: Secondary | ICD-10-CM | POA: Diagnosis not present

## 2024-07-19 DIAGNOSIS — M1611 Unilateral primary osteoarthritis, right hip: Secondary | ICD-10-CM | POA: Diagnosis not present

## 2024-07-19 DIAGNOSIS — J45909 Unspecified asthma, uncomplicated: Secondary | ICD-10-CM | POA: Diagnosis not present

## 2024-07-19 DIAGNOSIS — K648 Other hemorrhoids: Secondary | ICD-10-CM | POA: Diagnosis not present

## 2024-07-19 DIAGNOSIS — G4700 Insomnia, unspecified: Secondary | ICD-10-CM | POA: Diagnosis not present

## 2024-07-19 DIAGNOSIS — Z792 Long term (current) use of antibiotics: Secondary | ICD-10-CM | POA: Diagnosis not present

## 2024-07-19 MED ORDER — SUCRALFATE 1 GM/10ML PO SUSP: 1.0000 g | Freq: Four times a day (QID) | ORAL | 1 refills | Status: DC

## 2024-07-19 NOTE — Telephone Encounter (Signed)
 Let's try him on short course of sucralfate  1 g TIDAC and HS x 2 weeks If he is not yet on PPI he can remain off while he tries the sucralfate  If that is not helpful we could also try low-dose vonoprazan next

## 2024-07-19 NOTE — Telephone Encounter (Signed)
 Changed EOT to 08/01/24 - thanks!

## 2024-07-20 ENCOUNTER — Telehealth: Payer: Self-pay | Admitting: Internal Medicine

## 2024-07-20 ENCOUNTER — Encounter: Payer: Self-pay | Admitting: Internal Medicine

## 2024-07-20 DIAGNOSIS — Z860101 Personal history of adenomatous and serrated colon polyps: Secondary | ICD-10-CM | POA: Diagnosis not present

## 2024-07-20 DIAGNOSIS — Z792 Long term (current) use of antibiotics: Secondary | ICD-10-CM | POA: Diagnosis not present

## 2024-07-20 DIAGNOSIS — I4891 Unspecified atrial fibrillation: Secondary | ICD-10-CM | POA: Diagnosis not present

## 2024-07-20 DIAGNOSIS — J45909 Unspecified asthma, uncomplicated: Secondary | ICD-10-CM | POA: Diagnosis not present

## 2024-07-20 DIAGNOSIS — Z9181 History of falling: Secondary | ICD-10-CM | POA: Diagnosis not present

## 2024-07-20 DIAGNOSIS — K219 Gastro-esophageal reflux disease without esophagitis: Secondary | ICD-10-CM | POA: Diagnosis not present

## 2024-07-20 DIAGNOSIS — N390 Urinary tract infection, site not specified: Secondary | ICD-10-CM | POA: Diagnosis not present

## 2024-07-20 DIAGNOSIS — H547 Unspecified visual loss: Secondary | ICD-10-CM | POA: Diagnosis not present

## 2024-07-20 DIAGNOSIS — M4692 Unspecified inflammatory spondylopathy, cervical region: Secondary | ICD-10-CM | POA: Diagnosis not present

## 2024-07-20 DIAGNOSIS — E7800 Pure hypercholesterolemia, unspecified: Secondary | ICD-10-CM | POA: Diagnosis not present

## 2024-07-20 DIAGNOSIS — G4700 Insomnia, unspecified: Secondary | ICD-10-CM | POA: Diagnosis not present

## 2024-07-20 DIAGNOSIS — I2510 Atherosclerotic heart disease of native coronary artery without angina pectoris: Secondary | ICD-10-CM | POA: Diagnosis not present

## 2024-07-20 DIAGNOSIS — M1611 Unilateral primary osteoarthritis, right hip: Secondary | ICD-10-CM | POA: Diagnosis not present

## 2024-07-20 DIAGNOSIS — Z452 Encounter for adjustment and management of vascular access device: Secondary | ICD-10-CM | POA: Diagnosis not present

## 2024-07-20 DIAGNOSIS — N411 Chronic prostatitis: Secondary | ICD-10-CM | POA: Diagnosis not present

## 2024-07-20 DIAGNOSIS — M109 Gout, unspecified: Secondary | ICD-10-CM | POA: Diagnosis not present

## 2024-07-20 DIAGNOSIS — B9620 Unspecified Escherichia coli [E. coli] as the cause of diseases classified elsewhere: Secondary | ICD-10-CM | POA: Diagnosis not present

## 2024-07-20 DIAGNOSIS — K648 Other hemorrhoids: Secondary | ICD-10-CM | POA: Diagnosis not present

## 2024-07-20 DIAGNOSIS — Z79899 Other long term (current) drug therapy: Secondary | ICD-10-CM | POA: Diagnosis not present

## 2024-07-20 DIAGNOSIS — G4730 Sleep apnea, unspecified: Secondary | ICD-10-CM | POA: Diagnosis not present

## 2024-07-20 DIAGNOSIS — M51369 Other intervertebral disc degeneration, lumbar region without mention of lumbar back pain or lower extremity pain: Secondary | ICD-10-CM | POA: Diagnosis not present

## 2024-07-20 DIAGNOSIS — N419 Inflammatory disease of prostate, unspecified: Secondary | ICD-10-CM | POA: Diagnosis not present

## 2024-07-20 NOTE — Telephone Encounter (Signed)
 No appointments available until 8/20. Provider has requested that pharmacy team reach out to discuss further with patient.   Torrance Frech, BSN, RN

## 2024-07-20 NOTE — Telephone Encounter (Signed)
 Called and spoke with patient in regards to abdominal discomfort with ertapenem . He states it is improving and notices it only during the day. He is taking probiotics. Noted as previously discussed given histoy of gi intolerance with abx, limited option available(also MDRO/esbl ecoli). HE is amenable to continuing abx. If urgent needs then go to ED.

## 2024-07-21 ENCOUNTER — Telehealth: Payer: Self-pay

## 2024-07-21 LAB — URINE CULTURE: Culture: 20000 — AB

## 2024-07-21 NOTE — Telephone Encounter (Signed)
 Received call from Landry Acre, RN with [facility] wanting to know if EOT had been changed. Relayed that EOT was adjusted to 8/18 per Alan Geralds, [facility] since patient started OPAT earlier than originally scheduled.   Updated EOT sent to Holley Herring, RN with [facility].   Buzz Axel, BSN, RN

## 2024-07-22 ENCOUNTER — Ambulatory Visit (HOSPITAL_COMMUNITY)

## 2024-07-22 ENCOUNTER — Inpatient Hospital Stay (HOSPITAL_COMMUNITY): Admission: RE | Admit: 2024-07-22 | Source: Ambulatory Visit

## 2024-07-22 ENCOUNTER — Telehealth (HOSPITAL_BASED_OUTPATIENT_CLINIC_OR_DEPARTMENT_OTHER): Payer: Self-pay

## 2024-07-22 NOTE — Telephone Encounter (Signed)
 Post ED Visit - Positive Culture Follow-up  Culture report reviewed by antimicrobial stewardship pharmacist: [facility] []  Rankin Dee, Pharm.D. []  Venetia Gully, Pharm.D., BCPS AQ-ID []  Garrel Crews, Pharm.D., BCPS []  Almarie Lunger, Pharm.D., BCPS []  [place], [address].D., BCPS, AAHIVP []  Rosaline Bihari, Pharm.D., BCPS, AAHIVP []  Vernell Meier, PharmD, BCPS []  Latanya Hint, PharmD, BCPS []  Donald Medley, PharmD, BCPS []  Rocky Bold, PharmD []  Dorothyann Alert, PharmD, BCPS []  Morene Babe, PharmD  [facility] [x]  Izetta Carl, PharmD []  Romona Bliss, PharmD []  Dolphus Roller, PharmD []  Veva Seip, Rph []  Vernell Daunt) Leonce, PharmD []  Eva Allis, PharmD []  Rosaline Millet, PharmD []  Iantha Batch, PharmD []  Arvin Gauss, PharmD []  Wanda Hasting, PharmD []  Ronal Rav, PharmD []  Rocky Slade, PharmD []  Bard Jeans, PharmD   Positive urine culture  Hx of urinary retention and ESBL E Coli UTI was given macrobid  and Fosfomycin doses without significant relief. Due to pain in pelvic and suprapubic region and urinary frequency pt received PICC and ertapenem  x 2 weeks with EOT on 08/01/24.  No further patient follow-up is required at this time.  [name] 07/22/2024, 11:56 AM

## 2024-07-23 DIAGNOSIS — N411 Chronic prostatitis: Secondary | ICD-10-CM | POA: Diagnosis not present

## 2024-07-25 ENCOUNTER — Encounter: Payer: Self-pay | Admitting: Internal Medicine

## 2024-07-25 DIAGNOSIS — Z452 Encounter for adjustment and management of vascular access device: Secondary | ICD-10-CM | POA: Diagnosis not present

## 2024-07-25 DIAGNOSIS — Z792 Long term (current) use of antibiotics: Secondary | ICD-10-CM | POA: Diagnosis not present

## 2024-07-25 DIAGNOSIS — N390 Urinary tract infection, site not specified: Secondary | ICD-10-CM | POA: Diagnosis not present

## 2024-07-25 DIAGNOSIS — Z79899 Other long term (current) drug therapy: Secondary | ICD-10-CM | POA: Diagnosis not present

## 2024-07-25 DIAGNOSIS — Z860101 Personal history of adenomatous and serrated colon polyps: Secondary | ICD-10-CM | POA: Diagnosis not present

## 2024-07-25 DIAGNOSIS — I2510 Atherosclerotic heart disease of native coronary artery without angina pectoris: Secondary | ICD-10-CM | POA: Diagnosis not present

## 2024-07-25 DIAGNOSIS — Z9181 History of falling: Secondary | ICD-10-CM | POA: Diagnosis not present

## 2024-07-25 DIAGNOSIS — J45909 Unspecified asthma, uncomplicated: Secondary | ICD-10-CM | POA: Diagnosis not present

## 2024-07-25 DIAGNOSIS — Z0000 Encounter for general adult medical examination without abnormal findings: Secondary | ICD-10-CM | POA: Diagnosis not present

## 2024-07-25 DIAGNOSIS — K648 Other hemorrhoids: Secondary | ICD-10-CM | POA: Diagnosis not present

## 2024-07-25 DIAGNOSIS — Z5181 Encounter for therapeutic drug level monitoring: Secondary | ICD-10-CM | POA: Diagnosis not present

## 2024-07-25 DIAGNOSIS — M109 Gout, unspecified: Secondary | ICD-10-CM | POA: Diagnosis not present

## 2024-07-25 DIAGNOSIS — E7800 Pure hypercholesterolemia, unspecified: Secondary | ICD-10-CM | POA: Diagnosis not present

## 2024-07-25 DIAGNOSIS — M51369 Other intervertebral disc degeneration, lumbar region without mention of lumbar back pain or lower extremity pain: Secondary | ICD-10-CM | POA: Diagnosis not present

## 2024-07-25 DIAGNOSIS — N419 Inflammatory disease of prostate, unspecified: Secondary | ICD-10-CM | POA: Diagnosis not present

## 2024-07-25 DIAGNOSIS — M1611 Unilateral primary osteoarthritis, right hip: Secondary | ICD-10-CM | POA: Diagnosis not present

## 2024-07-25 DIAGNOSIS — G4730 Sleep apnea, unspecified: Secondary | ICD-10-CM | POA: Diagnosis not present

## 2024-07-25 DIAGNOSIS — H547 Unspecified visual loss: Secondary | ICD-10-CM | POA: Diagnosis not present

## 2024-07-25 DIAGNOSIS — M4692 Unspecified inflammatory spondylopathy, cervical region: Secondary | ICD-10-CM | POA: Diagnosis not present

## 2024-07-25 DIAGNOSIS — I4891 Unspecified atrial fibrillation: Secondary | ICD-10-CM | POA: Diagnosis not present

## 2024-07-25 DIAGNOSIS — K219 Gastro-esophageal reflux disease without esophagitis: Secondary | ICD-10-CM | POA: Diagnosis not present

## 2024-07-25 DIAGNOSIS — G4700 Insomnia, unspecified: Secondary | ICD-10-CM | POA: Diagnosis not present

## 2024-07-25 DIAGNOSIS — E232 Diabetes insipidus: Secondary | ICD-10-CM | POA: Diagnosis not present

## 2024-07-25 DIAGNOSIS — B9620 Unspecified Escherichia coli [E. coli] as the cause of diseases classified elsewhere: Secondary | ICD-10-CM | POA: Diagnosis not present

## 2024-07-28 DIAGNOSIS — M1612 Unilateral primary osteoarthritis, left hip: Secondary | ICD-10-CM | POA: Diagnosis not present

## 2024-07-29 ENCOUNTER — Telehealth: Payer: Self-pay | Admitting: Podiatry

## 2024-07-29 ENCOUNTER — Encounter: Payer: Self-pay | Admitting: Podiatry

## 2024-07-29 ENCOUNTER — Ambulatory Visit (INDEPENDENT_AMBULATORY_CARE_PROVIDER_SITE_OTHER): Admitting: Podiatry

## 2024-07-29 DIAGNOSIS — N411 Chronic prostatitis: Secondary | ICD-10-CM | POA: Diagnosis not present

## 2024-07-29 DIAGNOSIS — L600 Ingrowing nail: Secondary | ICD-10-CM

## 2024-07-29 NOTE — Telephone Encounter (Signed)
 Patient called inquiring about what to expect after the nail procedure. After the lidocaine  wears off he wants to know what his expected pain should be. And if he does have pain he is wanting to know what he can take to help alleviate. Thanks

## 2024-07-29 NOTE — Progress Notes (Unsigned)
 Subjective:  Patient ID: [name], male    DOB: 25-Jul-1965,  MRN: [id number]  Chief Complaint  Patient presents with   Nail Problem    68 y.o. male presents with the above complaint.  Patient presents with for left hallux medial border ingrown painful to touch has progressed gotten worse worse with ambulation or shoe pressure she would like to have it removed has not seen anyone else prior to seeing me denies any other acute complaints.  Pain scale 7 out of 10 dull achy in nature.   Review of Systems: Negative except as noted in the HPI. Denies N/V/F/Ch.  No past medical history on file.  Current Outpatient Medications:    carvedilol (COREG) 3.125 MG tablet, Take 3.125 mg by mouth 2 (two) times daily., Disp: , Rfl:    COREG CR 10 MG 24 hr capsule, Take 10 mg by mouth daily., Disp: , Rfl:    losartan-hydrochlorothiazide (HYZAAR) 100-12.5 MG tablet, Take 1 tablet by mouth daily., Disp: , Rfl:    MOUNJARO 5 MG/0.5ML Pen, SMARTSIG:5 Milligram(s) SUB-Q Once a Week, Disp: , Rfl:    OZEMPIC, 0.25 OR 0.5 MG/DOSE, 2 MG/3ML SOPN, Inject 0.5 mg into the skin once a week., Disp: , Rfl:    tiZANidine (ZANAFLEX) 4 MG tablet, Take 4 mg by mouth 3 (three) times daily., Disp: , Rfl:    lisinopril (ZESTRIL) 20 MG tablet, Take by mouth., Disp: , Rfl:    lisinopril-hydrochlorothiazide (ZESTORETIC) 20-12.5 MG tablet, Take 2 tablets by mouth daily., Disp: , Rfl:    metFORMIN (GLUCOPHAGE-XR) 500 MG 24 hr tablet, Take 1,000 mg by mouth 2 (two) times daily., Disp: , Rfl:    Multiple Vitamin (THERA) TABS, Take 1 tablet by mouth daily., Disp: , Rfl:    predniSONE (DELTASONE) 10 MG tablet, Take 6 tabs po on day 1 and decrease by 1 tab daily until complete, Disp: 21 tablet, Rfl: 0   rosuvastatin (CRESTOR) 10 MG tablet, Take 10 mg by mouth daily., Disp: , Rfl:   Social History   Tobacco Use  Smoking Status Never  Smokeless Tobacco Never    Allergies  Allergen Reactions   Cimetidine Other (See  Comments)   Codeine Other (See Comments)   Morphine Rash and Other (See Comments)   Tetanus Toxoid Hives   Pseudoephedrine Hcl Rash   Sulfa Antibiotics Rash   Objective:  There were no vitals filed for this visit. There is no height or weight on file to calculate BMI. Constitutional Well developed. Well nourished.  Vascular Dorsalis pedis pulses palpable bilaterally. Posterior tibial pulses palpable bilaterally. Capillary refill normal to all digits.  No cyanosis or clubbing noted. Pedal hair growth normal.  Neurologic Normal speech. Oriented to person, place, and time. Epicritic sensation to light touch grossly present bilaterally.  Dermatologic Painful ingrowing nail at medial nail borders of the hallux nail left. No other open wounds. No skin lesions.  Orthopedic: Normal joint ROM without pain or crepitus bilaterally. No visible deformities. No bony tenderness.   Radiographs: None Assessment:   1. Ingrown left big toenail    Plan:  Patient was evaluated and treated and all questions answered.  Ingrown Nail, left -Patient elects to proceed with minor surgery to remove ingrown toenail removal today. Consent reviewed and signed by patient. -Ingrown nail excised. See procedure note. -Educated on post-procedure care including soaking. Written instructions provided and reviewed. -Patient to follow up in 2 weeks for nail check.  Procedure: Excision of Ingrown Toenail Location: Left 1st  toe medial nail borders. Anesthesia: Lidocaine 1% plain; 1.5 mL and Marcaine 0.5% plain; 1.5 mL, digital block. Skin Prep: Betadine. Dressing: Silvadene; telfa; dry, sterile, compression dressing. Technique: Following skin prep, the toe was exsanguinated and a tourniquet was secured at the base of the toe. The affected nail border was freed, split with a nail splitter, and excised. Chemical matrixectomy was then performed with phenol and irrigated out with alcohol. The tourniquet was then  removed and sterile dressing applied. Disposition: Patient tolerated procedure well. Patient to return in 2 weeks for follow-up.   No follow-ups on file.

## 2024-07-29 NOTE — Patient Instructions (Signed)

## 2024-07-30 ENCOUNTER — Encounter: Payer: Self-pay | Admitting: Podiatry

## 2024-07-30 MED ORDER — SULFAMETHOXAZOLE-TRIMETHOPRIM 800-160 MG PO TABS: 1.0000 | ORAL_TABLET | Freq: Two times a day (BID) | ORAL | 0 refills | Status: AC

## 2024-08-01 DIAGNOSIS — Z792 Long term (current) use of antibiotics: Secondary | ICD-10-CM | POA: Diagnosis not present

## 2024-08-01 DIAGNOSIS — N390 Urinary tract infection, site not specified: Secondary | ICD-10-CM | POA: Diagnosis not present

## 2024-08-01 DIAGNOSIS — H547 Unspecified visual loss: Secondary | ICD-10-CM | POA: Diagnosis not present

## 2024-08-01 DIAGNOSIS — Z79899 Other long term (current) drug therapy: Secondary | ICD-10-CM | POA: Diagnosis not present

## 2024-08-01 DIAGNOSIS — M1611 Unilateral primary osteoarthritis, right hip: Secondary | ICD-10-CM | POA: Diagnosis not present

## 2024-08-01 DIAGNOSIS — G4730 Sleep apnea, unspecified: Secondary | ICD-10-CM | POA: Diagnosis not present

## 2024-08-01 DIAGNOSIS — M109 Gout, unspecified: Secondary | ICD-10-CM | POA: Diagnosis not present

## 2024-08-01 DIAGNOSIS — G4700 Insomnia, unspecified: Secondary | ICD-10-CM | POA: Diagnosis not present

## 2024-08-01 DIAGNOSIS — N419 Inflammatory disease of prostate, unspecified: Secondary | ICD-10-CM | POA: Diagnosis not present

## 2024-08-01 DIAGNOSIS — Z860101 Personal history of adenomatous and serrated colon polyps: Secondary | ICD-10-CM | POA: Diagnosis not present

## 2024-08-01 DIAGNOSIS — B9620 Unspecified Escherichia coli [E. coli] as the cause of diseases classified elsewhere: Secondary | ICD-10-CM | POA: Diagnosis not present

## 2024-08-01 DIAGNOSIS — Z452 Encounter for adjustment and management of vascular access device: Secondary | ICD-10-CM | POA: Diagnosis not present

## 2024-08-01 DIAGNOSIS — J45909 Unspecified asthma, uncomplicated: Secondary | ICD-10-CM | POA: Diagnosis not present

## 2024-08-01 DIAGNOSIS — Z9181 History of falling: Secondary | ICD-10-CM | POA: Diagnosis not present

## 2024-08-01 DIAGNOSIS — M51369 Other intervertebral disc degeneration, lumbar region without mention of lumbar back pain or lower extremity pain: Secondary | ICD-10-CM | POA: Diagnosis not present

## 2024-08-01 DIAGNOSIS — K648 Other hemorrhoids: Secondary | ICD-10-CM | POA: Diagnosis not present

## 2024-08-01 DIAGNOSIS — K219 Gastro-esophageal reflux disease without esophagitis: Secondary | ICD-10-CM | POA: Diagnosis not present

## 2024-08-01 DIAGNOSIS — E7800 Pure hypercholesterolemia, unspecified: Secondary | ICD-10-CM | POA: Diagnosis not present

## 2024-08-01 DIAGNOSIS — I4891 Unspecified atrial fibrillation: Secondary | ICD-10-CM | POA: Diagnosis not present

## 2024-08-01 DIAGNOSIS — I2510 Atherosclerotic heart disease of native coronary artery without angina pectoris: Secondary | ICD-10-CM | POA: Diagnosis not present

## 2024-08-01 DIAGNOSIS — Z5181 Encounter for therapeutic drug level monitoring: Secondary | ICD-10-CM | POA: Diagnosis not present

## 2024-08-01 DIAGNOSIS — M4692 Unspecified inflammatory spondylopathy, cervical region: Secondary | ICD-10-CM | POA: Diagnosis not present

## 2024-08-02 DIAGNOSIS — M109 Gout, unspecified: Secondary | ICD-10-CM | POA: Diagnosis not present

## 2024-08-02 DIAGNOSIS — M51369 Other intervertebral disc degeneration, lumbar region without mention of lumbar back pain or lower extremity pain: Secondary | ICD-10-CM | POA: Diagnosis not present

## 2024-08-02 DIAGNOSIS — K219 Gastro-esophageal reflux disease without esophagitis: Secondary | ICD-10-CM | POA: Diagnosis not present

## 2024-08-02 DIAGNOSIS — G4730 Sleep apnea, unspecified: Secondary | ICD-10-CM | POA: Diagnosis not present

## 2024-08-02 DIAGNOSIS — M4692 Unspecified inflammatory spondylopathy, cervical region: Secondary | ICD-10-CM | POA: Diagnosis not present

## 2024-08-02 DIAGNOSIS — J45909 Unspecified asthma, uncomplicated: Secondary | ICD-10-CM | POA: Diagnosis not present

## 2024-08-02 DIAGNOSIS — Z79899 Other long term (current) drug therapy: Secondary | ICD-10-CM | POA: Diagnosis not present

## 2024-08-02 DIAGNOSIS — Z792 Long term (current) use of antibiotics: Secondary | ICD-10-CM | POA: Diagnosis not present

## 2024-08-02 DIAGNOSIS — Z452 Encounter for adjustment and management of vascular access device: Secondary | ICD-10-CM | POA: Diagnosis not present

## 2024-08-02 DIAGNOSIS — K648 Other hemorrhoids: Secondary | ICD-10-CM | POA: Diagnosis not present

## 2024-08-02 DIAGNOSIS — I2510 Atherosclerotic heart disease of native coronary artery without angina pectoris: Secondary | ICD-10-CM | POA: Diagnosis not present

## 2024-08-02 DIAGNOSIS — I4891 Unspecified atrial fibrillation: Secondary | ICD-10-CM | POA: Diagnosis not present

## 2024-08-02 DIAGNOSIS — Z860101 Personal history of adenomatous and serrated colon polyps: Secondary | ICD-10-CM | POA: Diagnosis not present

## 2024-08-02 DIAGNOSIS — B9620 Unspecified Escherichia coli [E. coli] as the cause of diseases classified elsewhere: Secondary | ICD-10-CM | POA: Diagnosis not present

## 2024-08-02 DIAGNOSIS — N390 Urinary tract infection, site not specified: Secondary | ICD-10-CM | POA: Diagnosis not present

## 2024-08-02 DIAGNOSIS — G4700 Insomnia, unspecified: Secondary | ICD-10-CM | POA: Diagnosis not present

## 2024-08-02 DIAGNOSIS — M1611 Unilateral primary osteoarthritis, right hip: Secondary | ICD-10-CM | POA: Diagnosis not present

## 2024-08-02 DIAGNOSIS — N419 Inflammatory disease of prostate, unspecified: Secondary | ICD-10-CM | POA: Diagnosis not present

## 2024-08-02 DIAGNOSIS — Z9181 History of falling: Secondary | ICD-10-CM | POA: Diagnosis not present

## 2024-08-02 DIAGNOSIS — H547 Unspecified visual loss: Secondary | ICD-10-CM | POA: Diagnosis not present

## 2024-08-02 DIAGNOSIS — E7800 Pure hypercholesterolemia, unspecified: Secondary | ICD-10-CM | POA: Diagnosis not present

## 2024-08-02 NOTE — Telephone Encounter (Signed)
 Thanks

## 2024-08-03 NOTE — Progress Notes (Unsigned)
 [name]

## 2024-08-04 ENCOUNTER — Ambulatory Visit: Admitting: Neurology

## 2024-08-04 ENCOUNTER — Encounter: Payer: Self-pay | Admitting: Neurology

## 2024-08-04 VITALS — BP 120/60 | HR 82 | Ht 69.0 in | Wt 139.8 lb

## 2024-08-04 DIAGNOSIS — G4733 Obstructive sleep apnea (adult) (pediatric): Secondary | ICD-10-CM | POA: Diagnosis not present

## 2024-08-04 DIAGNOSIS — R339 Retention of urine, unspecified: Secondary | ICD-10-CM | POA: Diagnosis not present

## 2024-08-04 NOTE — Progress Notes (Signed)
 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 Less need to void at night- 1-2 times.  Smart watch shows recovery of sleep  since being on CPAP , REM sleep and N3-4 sleep, he is very pleased.     Chief concern according to patient :  none.  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  His smart watch now shows recovery of rent and he has much less fragmented sleep and there is a progression to more sustained sleep seen on his smart watch report.  He also enters a deep sleep within the first 2 hours of sleep.  We did not see any central sleep apnea and we did see only 2 hypopneas which is remarkably low.  This patient is slender, he does report that he has lost some muscle mass basically because he was treated with antibiotics had GI symptoms, chronic urinary tract infections with current urinary retention and his sleep position also is restricted by shoulder pain and joint pain.      Review of Systems: Out of a complete 14 system review, the patient complains of only the following symptoms, and all other reviewed systems are negative.:   SLEEPINESS ?  How likely are you to doze in the following situations: 0 = not likely, 1 = slight chance, 2 = moderate chance, 3 = high chance  Sitting and Reading? Watching Television? Sitting inactive in a public place (theater or  meeting)? Lying down in the afternoon when circumstances permit? Sitting and talking to someone? Sitting quietly after lunch without alcohol ? In a car, while stopped for a few minutes in traffic? As a passenger in a car for an hour without a break?  Total =  1/ 24 , much reduced on CPAP   No recurrence of atrial fib. No longer  on beta blocker.  After bradycardia.    FSS 19.   Social History   Socioeconomic History   Marital status: Married    Spouse name: Not on file   Number of children: 2   Years of education: Not on file   Highest education level: Not on file  Occupational History   Occupation: MD  Tobacco Use   Smoking status: Never   Smokeless tobacco: Never  Vaping Use   Vaping status: Never Used  Substance and Sexual Activity   Alcohol  use: No   Drug use: No   Sexual activity: Not on file  Other Topics Concern   Not on file  Social History Narrative   Not on file   Social Drivers of Health   Financial Resource Strain: Not on file  Food Insecurity: Low Risk  (02/25/2024)   Received from [facility]   Hunger  Vital Sign    Within the past 12 months, you worried that your food would run out before you got money to buy more: Never true    Within the past 12 months, the food you bought just didn't last and you didn't have money to get more. : Never true  Transportation Needs: No Transportation Needs (02/25/2024)   Received from [facility]    In the past 12 months, has lack of reliable transportation kept you from medical appointments, meetings, work or from getting things needed for daily living? : No  Physical Activity: Not on file  Stress: Not on file  Social Connections: Not on file    Family History  Problem Relation Age of Onset   Heart disease Father    Colon cancer Neg Hx    Esophageal cancer Neg Hx    Stomach cancer Neg Hx    Rectal cancer Neg Hx     Past Medical History:  Diagnosis Date   Arthritis 03/2020   R hip    Asthma    Atherosclerotic heart disease of native coronary artery without angina pectoris    Atrial fibrillation (HCC)    DDD (degenerative disc disease), lumbar    Diabetes insipidus (HCC)    Elevated PSA    GERD (gastroesophageal reflux disease)    History of adenomatous polyp of colon    HLD (hyperlipidemia)    Hypercholesteremia    Insomnia    Internal hemorrhoids    Intracranial arachnoid cyst    Monoallelic mutation of AVP gene    Other chronic sinusitis    Other fatigue    PSA elevation    Sleep apnea    Snoring    Ulcerative colitis    Ulcerative colitis (HCC)     Past Surgical History:  Procedure Laterality Date   APPENDECTOMY  2009   ATRIAL FIBRILLATION ABLATION     COLONOSCOPY     LEFT HEART CATH AND CORONARY ANGIOGRAPHY N/A 05/06/2018   Procedure: LEFT HEART CATH AND CORONARY ANGIOGRAPHY;  Surgeon: Ladona Heinz, MD;  Location: [facility]VASIVE [facility];  Service: Cardiovascular;  Laterality: N/A;   LUMBAR LAMINECTOMY  1978   PROSTATE BIOPSY  06/07/2024     Current Outpatient Medications on File Prior to Visit  Medication Sig Dispense Refill   acetaminophen  (TYLENOL ) 500 MG tablet Take 500-1,000 mg by mouth as needed.     azelastine  (ASTELIN ) 0.1 % nasal spray Place 1 spray into both nostrils 2 (two) times daily. 30 mL 2   Cholecalciferol (VITAMIN D3) 125 MCG (5000 UT) TABS Take 5,000 tablets by mouth. Twice weekly.     desmopressin  (DDAVP ) 0.2 MG tablet Take 1 tablet (0.2 mg total) by mouth 2 (two) times daily as directed 60 tablet 12   diclofenac  Sodium (VOLTAREN ) 1 % GEL Apply 2 g topically to the affected area(s) 4 (four) times daily. 100 g 5   famotidine (PEPCID) 20 MG tablet Take 20 mg by mouth daily as needed.     fexofenadine (ALLEGRA) 180 MG tablet Take 180 mg by mouth.     Fluticasone  Propionate (XHANCE) 93 MCG/ACT EXHU Place 2 sprays into the nose 2 times daily at 12 noon and 4 pm.     hyoscyamine  (LEVSIN  SL) 0.125 MG SL tablet Take 1 by mouth every 6-8 hours  as needed 30 tablet 2   ipratropium (ATROVENT ) 0.06 % nasal spray Place 2 sprays into both nostrils 3 (three) times daily. 15 mL 5  mesalamine  (LIALDA ) 1.2 g EC tablet Take 2 tablets (2.4 g total) by mouth in the morning and at bedtime. 360 tablet 1   methocarbamol  (ROBAXIN ) 500 MG tablet Take 1 tablet (500 mg total) by mouth every 8 (eight) hours. 90 tablet 1   rosuvastatin  (CRESTOR ) 20 MG tablet Take 1 tablet (20 mg total) by mouth daily. 90 tablet 4   sulfamethoxazole -trimethoprim  (BACTRIM  DS) 800-160 MG tablet Take 1 tablet by mouth 2 (two) times daily. 14 tablet 0   tamsulosin  (FLOMAX ) 0.4 MG CAPS capsule Take 1 capsule (0.4 mg total) by mouth daily. 20 capsule 0   Vedolizumab  (ENTYVIO  IV) Inject into the vein.     Zolpidem  Tartrate 1.75 MG SUBL Place 1 tablet under the tongue once a night. 10 tablet 0   No current facility-administered medications on file prior to visit.    Allergies  Allergen Reactions   Amoxicillin-Pot Clavulanate Diarrhea and Nausea Only    Other Reaction(s): GI Intolerance   Cephalexin     Other Reaction(s): other   Doxycycline Diarrhea and Nausea Only    Other Reaction(s): GI Intolerance  doxycycline   Levofloxacin      Other Reaction(s): other   Metronidazole     Other Reaction(s): other   Prednisone      Other Reaction(s): other  Gastritis.   prednisone      DIAGNOSTIC DATA (LABS, IMAGING, TESTING) - I reviewed patient records, labs, notes, testing and imaging myself where available.  Lab Results  Component Value Date   WBC 7.9 07/18/2024   HGB 13.9 07/18/2024   HCT 42.6 07/18/2024   MCV 95.1 07/18/2024   PLT 223 07/18/2024      Component Value Date/Time   NA 138 07/18/2024 1758   K 3.4 (L) 07/18/2024 1758   CL 103 07/18/2024 1758   CO2 24 07/18/2024 1758   GLUCOSE 108 (H) 07/18/2024 1758   BUN 12 07/18/2024 1758   CREATININE 0.69 07/18/2024 1758   CALCIUM  9.4 07/18/2024 1758   PROT 7.0 07/13/2024 1557   PROT 6.8 04/12/2024 1215    ALBUMIN 4.0 07/13/2024 1557   AST 12 07/13/2024 1557   ALT 13 07/13/2024 1557   ALKPHOS 77 07/13/2024 1557   BILITOT 1.4 (H) 07/13/2024 1557   GFRNONAA >60 07/18/2024 1758   GFRAA >90 01/25/2012 0554   No results found for: CHOL, HDL, LDLCALC, LDLDIRECT, TRIG, CHOLHDL No results found for: YHAJ8R No results found for: VITAMINB12 No results found for: TSH  PHYSICAL EXAM:  Vitals:   08/04/24 0841  BP: 120/60  Pulse: 82   No data found. Body mass index is 20.64 kg/m.   Wt Readings from Last 3 Encounters:  08/04/24 139 lb 12.8 oz (63.4 kg)  07/14/24 143 lb 4 oz (65 kg)  07/14/24 147 lb (66.7 kg)     Ht Readings from Last 3 Encounters:  08/04/24 5' 9 (1.753 m)  07/14/24 5' 9 (1.753 m)  07/12/24 5' 9 (1.753 m)      General: The patient is awake, alert and appears not in acute distress and groomed. Head: Normocephalic, atraumatic.  Neck is supple. Mallampati 2-3,  neck circumference:14 inches .  Nasal airflow only right nostril was fully patent.  Deviated septum present.  Overbite present.   Dental status:  biological  Cardiovascular:  Regular rate and cardiac rhythm by pulse,  without distended neck veins. Respiratory: Lungs are clear to auscultation.  Skin:  Without evidence of ankle edema, or rash. Trunk: The patient's posture is erect.  NEUROLOGIC EXAM: The patient is awake and alert, oriented to place and time.   Memory subjective described as intact.  Attention span & concentration ability appears normal.  Speech is fluent,  without dysarthria, dysphonia or aphasia.  Mood and affect are appropriate.   Cranial nerves: no loss of smell or taste reported  Pupils are equal and briskly reactive to light. Funduscopic exam deferred.  Extraocular movements in vertical and horizontal planes were intact and without nystagmus. No Diplopia. Visual fields by finger perimetry are intact.   Hearing was intact to soft voice and finger rubbing.      Facial sensation intact to fine touch.    Facial motor strength is symmetric and tongue and uvula move midline.  Neck ROM : rotation, tilt and flexion extension were normal for age and shoulder shrug was symmetrical.    Motor exam:  Symmetric bulk, tone and ROM. Has lost weight -   loss of thenar eminence on the right hand, weakness of grip in the right hand.  Normal tone without cog -wheeling,     Sensory:  reported as intact . History of right radiculopathic  pain in the right arm, shoulder.    Coordination: Rapid alternating movements in the fingers/hands were of normal speed.  The Finger-to-nose maneuver was intact without evidence of ataxia, dysmetria or tremor.   Gait and station: Patient could rise unassisted from a seated position, walked without assistive device.  Stance is of normal width/ base .  Toe and heel walk were deferred.  Deep tendon reflexes:  Babinski response was deferred.Reflexes: Normal and symmetric throughout. No ankle clonus.     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 Overbite, small crowded oral cavity . He has a small neck size at 14 inches and has normal BMI of 20.6.     1) OSA on CPAP:  had a first night effect in the SPLIT study, Has severe OSA AHI 47/h and needed CPAP to enter REM sleep.   I would like to continue the current settings for his CPAP as he is well served.  If the current mask is comfortable will continue with the same interface.  The patient will be moved by the end of the year towards the [place] of [place]  and I would be happy to refer him to a colleague unless he wants to follow-up with virtual visits here.  Reduction of daytime sleepiness and recovery of deeper sleep, including REM sleep.   Remaining on auto CPAP. Nasal cradle , ResMEd      2) had recently several infections, following prostate biopsy- urinary retention, foley cath, and most  recently [facility] was placed for IV ATB, course completed.  Toe infection, hallux left. Ankle edema.  No DVT.       I would like to thank Dr Ladona, MD and   Janey Santos, Md [address] [place],  [place] [address] for allowing me to meet with this pleasant patient.   Sleep Clinic Patients are generally offered input on sleep hygiene, life style changes and how to improve compliance with medical treatment where applicable. Review and reiteration of good sleep hygiene measures is offered to any sleep clinic patient, be it in the first consultation or with any follow up visits.  Any patient with sleepiness should be cautioned not to drive, work at heights, or operate dangerous or heavy equipment when feeling tired or sleepy.  The patient will be seen in follow-up in the sleep clinic at [facility] for discussion of test results, sleep related symptoms and treatment compliance review, further management strategies, etc.   The referring provider will be notified of the test results.   The patient's condition requires frequent monitoring and adjustments in the treatment plan, reflecting the ongoing complexity of care.  This provider is the continuing focal point for all needed services for this condition.  After spending a total time of  30  minutes face to face and time for  history taking, physical and neurologic examination, review of laboratory studies,  personal review of imaging studies, reports and results of other testing and review of referral information / records as far as provided in visit,   Electronically signed by: Dedra Gores, MD 08/04/2024 8:45 AM  [facility] Board certified by The [employer] of Sleep Medicine and Diplomate of the [employer] of Sleep Medicine. Board certified In Neurology through the ABPN, Fellow of the [employer] of Neurology.

## 2024-08-04 NOTE — Patient Instructions (Signed)
 Living With Sleep Apnea Sleep apnea is a condition that affects your breathing while you're sleeping. Your tongue or the tissue in your throat may block the flow of air while you sleep. You may have shallow breathing or stop breathing for short periods of time. The breaks in breathing interrupt the deep sleep that you need to feel rested. Even if you don't wake up from the gaps in breathing, you may feel tired during the day. People with sleep apnea may snore loudly. You may have a headache in the morning and feel anxious or depressed. How can sleep apnea affect me? Sleep apnea increases your chances of being very tired during the day. This is called daytime fatigue. Sleep apnea can also increase your risk of: Heart attack. Stroke. Obesity. Type 2 diabetes. Heart failure. Irregular heartbeat. High blood pressure. If you are very tired during the day, you may be more likely to: Not do well in school or at work. Fall asleep while driving. Have trouble paying attention. Develop depression or anxiety. Have problems having sex. This is called sexual dysfunction. What actions can I take to manage sleep apnea? Sleep apnea treatment  If you were given a device to open your airway while you sleep, use it only as told by your health care provider. You may be given: An oral appliance. This is a mouthpiece that shifts your lower jaw forward. A continuous positive airway pressure (CPAP) device. This blows air through a mask. A nasal expiratory positive airway pressure (EPAP) device. This has valves that you put into each nostril. A bi-level positive airway pressure (BIPAP) device. This blows air through a mask when you breathe in and breathe out. You may need surgery if other treatments don't work for you. Sleep habits Go to sleep and wake up at the same time every day. This helps set your internal clock for sleeping. If you stay up later than usual on weekends, try to get up in the morning within 2  hours of the time you usually wake up. Try to get at least 7-9 hours of sleep each night. Stop using a computer, tablet, and mobile phone a few hours before bedtime. Do not take long naps during the day. If you nap, limit it to 30 minutes. Have a relaxing bedtime routine. Reading or listening to music may relax you and help you sleep. Use your bedroom only for sleep. Keep your television and computer out of your bedroom. Keep your bedroom cool, dark, and quiet. Use a supportive mattress and pillows. Follow your provider's instructions for other changes to sleep habits. Nutrition Do not eat big meals in the evening. Do not have caffeine in the later part of the day. The effects of caffeine can last for more than 5 hours. Follow your provider's instructions for any changes to what you eat and drink. Lifestyle Do not drink alcohol before bedtime. Alcohol can cause you to fall asleep at first, but then it can cause you to wake up in the middle of the night and have trouble getting back to sleep. Do not smoke, vape, or use nicotine or tobacco. Medicines Take over-the-counter and prescription medicines only as told by your provider. Do not use over-the-counter sleep medicine. You may become dependent on this medicine, and it can make sleep apnea worse. Do not take medicines, such as sedatives and narcotics, unless told to by your provider. Activity Exercise on most days, but avoid exercising in the evening. Exercising near bedtime can interfere with sleeping.  If possible, spend time outside every day. Natural light helps with your internal clock. General information Lose weight if you need to. Stay at a healthy weight. If you are having surgery, make sure to tell your provider that you have sleep apnea. You may need to bring your device with you. Keep all follow-up visits. Your provider will want to check on your condition. Where to find more information National Heart, Lung, and Blood  Institute: [employer] This information is not intended to replace advice given to you by your health care provider. Make sure you discuss any questions you have with your health care provider. Document Revised: 03/25/2023 Document Reviewed: 03/25/2023 Elsevier Patient Education  2024 Elsevier Inc.CPAP and BIPAP Information CPAP and BIPAP use air pressure to keep your airways open and help you breathe well. CPAP and BIPAP use different amounts of pressure. Your health care provider will tell you whether CPAP or BIPAP would be best for you. CPAP stands for continuous positive airway pressure. With CPAP, the amount of pressure stays the same while you breathe in and out. BIPAP stands for bi-level positive airway pressure. With BIPAP, the amount of pressure will be higher when you breathe in and lower when you breathe out. This allows you to take bigger breaths. CPAP or BIPAP may be used in the hospital or at home. You may need to have a sleep study before your provider can order a device for you to use at home. What are the advantages? CPAP and BIPAP are most often used for obstructive sleep apnea to keep the airways from collapsing when the muscles relax during sleep. CPAP or BIPAP can be used if you have: Chronic obstructive pulmonary disease. Heart failure. Medical conditions that cause muscle weakness. Other problems that cause breathing to be shallow, weak, or difficult. What are the risks? Your provider will talk with you about risks. These may include: Sores on your nose or face caused from the mask, prongs, or nasal pillows. Dry or stuffy nose or nosebleeds. Feeling gassy or bloated. Sinus or lung infection if the equipment is not cleaned well. When should CPAP or BIPAP be used? In most cases, CPAP or BIPAP is used during sleep at night or whenever the main sleep time happens. It's also used during naps. People with some medical conditions may need to wear the mask when they're awake.  Follow instructions from your provider about when to use your CPAP or BIPAP. What happens during CPAP or BIPAP?  Both CPAP and BIPAP use a small machine that uses electricity to create air pressure. A long tube connects the device to a plastic mask. Air is blown through the mask into your nose or mouth. The amount of pressure that's used to blow the air can be adjusted. Your provider will set the pressure setting and help you find the best mask for you. Tips for using the mask There are different types and sizes of masks. If your mask does not fit well, talk with your provider about getting a different one. Some common types of masks include: Full face masks, which fit over the mouth and nose. Nasal masks, which fit over the nose. Nasal pillow or prong masks, which fit into the nostrils. The mask needs to be snug to your face, so some people feel trapped or closed in at first. If you feel this way, you may need to get used to the mask. Hold the mask loosely over your nose or mouth and then gradually put the  the mask on more snugly. Slowly increase the amount of time you use the mask. If you have trouble with your mask not fitting well or leaking, talk with your provider. Do not stop using the mask. Tips for using the device Follow instructions from your provider about how to and how often to use the device. For home use, CPAP and BIPAP devices come from home health care companies. There are many different brands. Your health insurance company will help to decide which device you get. Keep the CPAP or BIPAP device and attachments clean. Ask your home health care company or check the instruction book for cleaning instructions. Make sure the humidifier is filled with germ-free (sterile) water and is working correctly. This will help prevent a dry or stuffy nose or nosebleeds. A nasal saline mist or spray may keep your nose from getting dry and sore. Do not eat or drink while the CPAP or BIPAP device  is on. Food or drinks could get pushed into your lungs by the pressure of the CPAP or BIPAP. Follow these instructions at home: Take over-the-counter and prescription medicines only as told by your provider. Do not smoke, vape, or use nicotine or tobacco. Contact a health care provider if: You have redness or pressure sores on your head, face, mouth, or nose from the mask or headgear. You have trouble using the CPAP or BIPAP device. You have trouble going to sleep or staying asleep. Someone tells you that you snore even when wearing your CPAP or BIPAP device. Get help right away if: You have trouble breathing. You feel confused. These symptoms may be an emergency. Get help right away. Call 911. Do not wait to see if the symptoms will go away. Do not drive yourself to the hospital. This information is not intended to replace advice given to you by your health care provider. Make sure you discuss any questions you have with your health care provider. Document Revised: 03/25/2023 Document Reviewed: 03/25/2023 Elsevier Patient Education  2024 [employer].

## 2024-08-05 ENCOUNTER — Encounter: Payer: Self-pay | Admitting: Podiatry

## 2024-08-05 MED ORDER — CLINDAMYCIN HCL 300 MG PO CAPS: 300.0000 mg | ORAL_CAPSULE | Freq: Three times a day (TID) | ORAL | 0 refills | Status: AC

## 2024-08-08 ENCOUNTER — Telehealth: Payer: Self-pay

## 2024-08-08 ENCOUNTER — Ambulatory Visit: Admitting: Infectious Disease

## 2024-08-08 DIAGNOSIS — M25562 Pain in left knee: Secondary | ICD-10-CM | POA: Diagnosis not present

## 2024-08-08 NOTE — Telephone Encounter (Signed)
 Patient called with concerns of his toe. He states he feels it is infected and not improving. Patient would like to be seen her at [facility] for this. Patient will see Dr. Fleeta Rothman today for his toe.  Akeya Ryther ONEIDA Ligas, CMA

## 2024-08-10 DIAGNOSIS — G4733 Obstructive sleep apnea (adult) (pediatric): Secondary | ICD-10-CM | POA: Diagnosis not present

## 2024-08-11 ENCOUNTER — Other Ambulatory Visit: Payer: Self-pay

## 2024-08-11 ENCOUNTER — Ambulatory Visit: Admitting: Internal Medicine

## 2024-08-11 ENCOUNTER — Encounter: Payer: Self-pay | Admitting: Internal Medicine

## 2024-08-11 VITALS — BP 151/75 | HR 96 | Temp 97.6°F | Wt 151.0 lb

## 2024-08-11 DIAGNOSIS — Z1612 Extended spectrum beta lactamase (ESBL) resistance: Secondary | ICD-10-CM

## 2024-08-11 DIAGNOSIS — N390 Urinary tract infection, site not specified: Secondary | ICD-10-CM

## 2024-08-11 DIAGNOSIS — M2012 Hallux valgus (acquired), left foot: Secondary | ICD-10-CM | POA: Diagnosis not present

## 2024-08-11 DIAGNOSIS — B9620 Unspecified Escherichia coli [E. coli] as the cause of diseases classified elsewhere: Secondary | ICD-10-CM | POA: Diagnosis not present

## 2024-08-11 NOTE — Progress Notes (Signed)
 Patient: Corey Sor, [name]  DOB: 23-Jan-1956 MRN: [id number] PCP: Janey Santos, [name]      Patient Active Problem List   Diagnosis Date Noted   Severe obstructive sleep apnea-hypopnea syndrome 08/04/2024   Rhinitis, non-allergic 04/12/2024   Snoring 04/12/2024   Non-restorative sleep 04/12/2024   Nasal septal deviation 04/12/2024   Muscle mass 04/12/2024   Excessive daytime sleepiness 04/12/2024   History of nocturia 04/12/2024   Pain in joint of left knee 05/26/2022   Neck pain 10/22/2021   Elevated prostate specific antigen (PSA) 10/08/2021   Lumbar pain 06/19/2021   Pain of left hip joint 04/27/2020   Shortness of breath 05/05/2018   Abnormal stress ECG with treadmill 05/05/2018   Hypercholesteremia    Left sided colitis (HCC)      Subjective:  Corey Sor, [name] is a 68 y.o. 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 Nicholaus with subsequent Foley catheter(4-5 days) placement secondary to retention which had been removed.  Subsequent developed dysuria and was found to have ESBL E. coli growing on urine cultures on 6/28(pt states no symtomss).  Treated with fosfomycin x 1 dose then communicated with urology.  Patient on 7/22 noted to have dysuria, frequency, suprapubic pressure, perform self cath to collect urine samples noted to be leukocyte and nitrite positive and given another 3 doses of fosfomycin every 3 days. Today 07/14/24:  -6/28 pt went to ED for urinary retention, no dysuria->esbl ecoli, rx bactrim   -Pt had syrua, increased urinary frequency started on the 17th-> fosfomycin on Monday. Symotms improved - 3 days laster symotms re-occurred->given 3 more doses of fosfomycin with  urology-> last dose on 7/29 he notes mild abdomianl pain, epigarstric sometimes cramping,  thinks associated with fosfomycin. Slightly loose stool. Last BM Tuesday , which was formed.  No dysuria today, no suprapubic tenderness today. He is concerned his symptoms may return.  Interim: developed dysuria, not improved with macrobid ->placed on ertapenem  x 2 weeks EOT 8/21 Today 08/07/24: Dong well. Overall feels better, he thinks sometimes he gets mild dysuria that resolves on its own. No urinary complaints. Today. HE has a great toe where toe in grwon toe nail was ecised that he had some erythema(now resolved after a round of abx). He stes he started taking bactrim  about 3 days ago as well for second round as he felt erythema at toe nail had started back up.   Review of Systems  All other systems reviewed and are negative.   Past Medical History:  Diagnosis Date   Arthritis 03/2020   R hip   Asthma    Atherosclerotic heart disease of native coronary artery without angina pectoris    Atrial fibrillation (HCC)    DDD (degenerative disc disease), lumbar    Diabetes insipidus (HCC)    Elevated PSA    GERD (gastroesophageal reflux disease)    History of adenomatous polyp of colon    HLD (hyperlipidemia)    Hypercholesteremia    Insomnia    Internal hemorrhoids    Intracranial arachnoid cyst    Monoallelic mutation of AVP gene    Other chronic sinusitis    Other fatigue    PSA elevation    Sleep apnea    Snoring    Ulcerative colitis    Ulcerative colitis (HCC)     Outpatient Medications Prior to  Visit  Medication Sig Dispense Refill   acetaminophen  (TYLENOL ) 500 MG tablet Take 500-1,000 mg by mouth as needed.     azelastine  (ASTELIN ) 0.1 % nasal spray Place 1 spray into both nostrils 2 (two) times daily. 30 mL 2   Cholecalciferol (VITAMIN D3) 125 MCG (5000 UT) TABS Take 5,000 tablets by mouth. Twice weekly.     clindamycin  (CLEOCIN ) 300 MG capsule Take 1 capsule (300 mg total) by mouth 3 (three) times daily. 15 capsule 0   desmopressin  (DDAVP ) 0.2 MG tablet Take 1 tablet  (0.2 mg total) by mouth 2 (two) times daily as directed 60 tablet 12   diclofenac  Sodium (VOLTAREN ) 1 % GEL Apply 2 g topically to the affected area(s) 4 (four) times daily. 100 g 5   famotidine (PEPCID) 20 MG tablet Take 20 mg by mouth daily as needed.     fexofenadine (ALLEGRA) 180 MG tablet Take 180 mg by mouth.     Fluticasone  Propionate (XHANCE) 93 MCG/ACT EXHU Place 2 sprays into the nose 2 times daily at 12 noon and 4 pm.     hyoscyamine  (LEVSIN  SL) 0.125 MG SL tablet Take 1 by mouth every 6-8 hours as needed 30 tablet 2   ipratropium (ATROVENT ) 0.06 % nasal spray Place 2 sprays into both nostrils 3 (three) times daily. 15 mL 5   mesalamine  (LIALDA ) 1.2 g EC tablet Take 2 tablets (2.4 g total) by mouth in the morning and at bedtime. 360 tablet 1   methocarbamol  (ROBAXIN ) 500 MG tablet Take 1 tablet (500 mg total) by mouth every 8 (eight) hours. 90 tablet 1   rosuvastatin  (CRESTOR ) 20 MG tablet Take 1 tablet (20 mg total) by mouth daily. 90 tablet 4   sulfamethoxazole -trimethoprim  (BACTRIM  DS) 800-160 MG tablet Take 1 tablet by mouth 2 (two) times daily. 14 tablet 0   tamsulosin  (FLOMAX ) 0.4 MG CAPS capsule Take 1 capsule (0.4 mg total) by mouth daily. 20 capsule 0   Vedolizumab  (ENTYVIO  IV) Inject into the vein.     Zolpidem  Tartrate 1.75 MG SUBL Place 1 tablet under the tongue once a night. 10 tablet 0   No facility-administered medications prior to visit.     Allergies  Allergen Reactions   Amoxicillin-Pot Clavulanate Diarrhea and Nausea Only    Other Reaction(s): GI Intolerance   Cephalexin     Other Reaction(s): other   Doxycycline Diarrhea and Nausea Only    Other Reaction(s): GI Intolerance  doxycycline   Levofloxacin      Other Reaction(s): other   Metronidazole     Other Reaction(s): other   Prednisone      Other Reaction(s): other  Gastritis.   prednisone     Social History   Tobacco Use   Smoking status: Never   Smokeless tobacco: Never  Vaping Use   Vaping  status: Never Used  Substance Use Topics   Alcohol  use: No   Drug use: No    Family History  Problem Relation Age of Onset   Heart disease Father    Colon cancer Neg Hx    Esophageal cancer Neg Hx    Stomach cancer Neg Hx    Rectal cancer Neg Hx     Objective:  There were no vitals filed for this visit. There is no height or weight on file to calculate BMI.  Physical Exam Constitutional:      General: He is not in acute distress.    Appearance: He is normal weight. He is not toxic-appearing.  HENT:  Head: Normocephalic and atraumatic.     Right Ear: External ear normal.     Left Ear: External ear normal.     Nose: No congestion or rhinorrhea.     Mouth/Throat:     Mouth: Mucous membranes are moist.     Pharynx: Oropharynx is clear.  Eyes:     Extraocular Movements: Extraocular movements intact.     Conjunctiva/sclera: Conjunctivae normal.     Pupils: Pupils are equal, round, and reactive to light.  Cardiovascular:     Rate and Rhythm: Normal rate and regular rhythm.     Heart sounds: No murmur heard.    No friction rub. No gallop.  Pulmonary:     Effort: Pulmonary effort is normal.     Breath sounds: Normal breath sounds.  Abdominal:     General: Abdomen is flat. Bowel sounds are normal.     Palpations: Abdomen is soft.  Musculoskeletal:        General: No swelling.     Cervical back: Normal range of motion and neck supple.  Skin:    General: Skin is warm and dry.  Neurological:     General: No focal deficit present.     Mental Status: He is oriented to person, place, and time.  Psychiatric:        Mood and Affect: Mood normal.     Lab Results: Lab Results  Component Value Date   WBC 7.9 07/18/2024   HGB 13.9 07/18/2024   HCT 42.6 07/18/2024   MCV 95.1 07/18/2024   PLT 223 07/18/2024    Lab Results  Component Value Date   CREATININE 0.69 07/18/2024   BUN 12 07/18/2024   NA 138 07/18/2024   K 3.4 (L) 07/18/2024   CL 103 07/18/2024   CO2 24  07/18/2024    Lab Results  Component Value Date   ALT 13 07/13/2024   AST 12 07/13/2024   ALKPHOS 77 07/13/2024   BILITOT 1.4 (H) 07/13/2024     Assessment & Plan:  68 year old male with  ulcerative colitis/gerd/epigastric discomfort followed by GI, djd, followed by neurology for EDS, HLD, palpitations followed  by cardiolgist who underwent prostate biopsy presents for urine cultures that grew ESBL E. Coli #Esbl ecoli + rune Cx #Hx of urine retention - Following biopsy on 6/24 he had catheter which was eventually removed for urinary retention. Biopsy was benign tissue, chronic feature c/w atrophy, one biopsy noted small acinar proliferation. No prostatitis was noted.  - Patient then developed dysuria with urine cultures growing ESBL E. coli, urology was contacted after dose of fosfomycin.  He was having dysuria, pelvic pressure, frequency after dose of fosfomycin 7/28 urine cultures obtained again.  Given fosfomycin x 3 days every 3 days.  Referred to infectious disease. -Completed ertapenem  x 2 weeks on 8/21. Reviewed 8/18 labs-> stable. He states that the Flomax  is helping as well.  Notes that sometimes he has dysuria that resolves on its own. . No urinary complaints today.  - Pt is about to move to [place]. I noted that if urinary symptoms return, will place a referral to ID in [place].  -Follow-up with infectious disease as needed  #left hallus SP excision ingrown toenail on 8/15 with podiatry - Patient stated that he had some mild erythema at left hallux following excision.  Unclear if this was cellulitis versus just inflammation.  Noted that a round of Bactrim (short course) with resolution of erythema he is on a second round of Bactrim  started 3  days ago as he thought some of the diarrhea returned. On review of record bactrim  dispense don 8/16 x 7 days.  No concern for cellulitis today on exam.  Would stop antibiotics.  Loney Stank, [name]   08/11/24  8:32 AM  I have personally spent 45 minutes involved in face-to-face and non-face-to-face activities for this patient on the day of the visit. Professional time spent includes the following activities: Preparing to see the patient (review of tests), Obtaining and/or reviewing separately obtained history (admission/discharge record), Performing a medically appropriate examination and/or evaluation , Ordering medications/tests/procedures, referring and communicating with other health care professionals, Documenting clinical information in the EMR, Independently interpreting results (not separately reported), Communicating results to the patient/family/caregiver, Counseling and educating the patient/family/caregiver and Care coordination (not separately reported).

## 2024-08-23 ENCOUNTER — Telehealth: Payer: Self-pay

## 2024-08-23 ENCOUNTER — Encounter: Payer: Self-pay | Admitting: Internal Medicine

## 2024-08-23 ENCOUNTER — Telehealth: Payer: Self-pay | Admitting: Internal Medicine

## 2024-08-23 DIAGNOSIS — R399 Unspecified symptoms and signs involving the genitourinary system: Secondary | ICD-10-CM | POA: Diagnosis not present

## 2024-08-23 NOTE — Telephone Encounter (Signed)
 Woke up pt dysuria and abdominal pain, blood in urine this AM. Counseled extensively to go to ED for further evaluation. Based on prior urine Cx no PO regimens available for complicated UTI. Pt completed IV erta x 2 weeks on 8/21.

## 2024-08-23 NOTE — Telephone Encounter (Signed)
 Patient called c/o burning with urination with visible blood. Patient has also contacted urologist. Routing to Dr.Singh to advise.    Gertrude Bucks SHAUNNA Letters, CMA

## 2024-08-24 ENCOUNTER — Ambulatory Visit: Admitting: Family Medicine

## 2024-08-24 ENCOUNTER — Encounter: Payer: Self-pay | Admitting: Internal Medicine

## 2024-08-24 DIAGNOSIS — K080 Exfoliation of teeth due to systemic causes: Secondary | ICD-10-CM | POA: Diagnosis not present

## 2024-08-25 ENCOUNTER — Telehealth: Payer: Self-pay | Admitting: Internal Medicine

## 2024-08-25 ENCOUNTER — Ambulatory Visit

## 2024-08-25 DIAGNOSIS — N390 Urinary tract infection, site not specified: Secondary | ICD-10-CM | POA: Diagnosis not present

## 2024-08-25 DIAGNOSIS — R399 Unspecified symptoms and signs involving the genitourinary system: Secondary | ICD-10-CM | POA: Diagnosis not present

## 2024-08-25 DIAGNOSIS — N200 Calculus of kidney: Secondary | ICD-10-CM | POA: Diagnosis not present

## 2024-08-25 NOTE — Telephone Encounter (Signed)
 Called pt he states he had urine culture(reviewed in care everywhere) done at urology a couple days ago which were negative(he having dysuria and blood in urine at that point).  He states his dysuria has not improved, with some blood in the urine. I noted it is unlikely infection given urine cultures are negative.  Will place urgent referral to [facility] for second opinion.  Asked that if he develops fever, chills, worsening dysuria to go to the ED.

## 2024-08-25 NOTE — Telephone Encounter (Signed)
 Patient called to follow up on mychart message. He spoke with his Urologist and was advised to reach out to Dr. Dennise  regarding blood in urine and pain. Patient not sure who to direct treatment question to at this time.  Will forward message to Dr. Dennise. Lorenda CHRISTELLA Code, RMA

## 2024-08-29 ENCOUNTER — Encounter: Payer: Self-pay | Admitting: Cardiology

## 2024-08-29 ENCOUNTER — Ambulatory Visit (INDEPENDENT_AMBULATORY_CARE_PROVIDER_SITE_OTHER)

## 2024-08-29 VITALS — BP 127/66 | HR 67 | Temp 97.6°F | Resp 16 | Ht 69.0 in | Wt 143.8 lb

## 2024-08-29 DIAGNOSIS — K529 Noninfective gastroenteritis and colitis, unspecified: Secondary | ICD-10-CM

## 2024-08-29 DIAGNOSIS — K5150 Left sided colitis without complications: Secondary | ICD-10-CM | POA: Diagnosis not present

## 2024-08-29 MED ORDER — VEDOLIZUMAB 300 MG IV SOLR
300.0000 mg | Freq: Once | INTRAVENOUS | Status: AC
  Administered 2024-08-29: 300 mg via INTRAVENOUS
  Filled 2024-08-29: qty 5

## 2024-08-29 NOTE — Progress Notes (Signed)
 Diagnosis: Sigmoiditis  Provider:  Praveen Mannam MD  Procedure: IV Infusion  IV Type: Peripheral, IV Location: L Antecubital  Entyvio  (Vedolizumab ), Dose: 300 mg  Infusion Start Time: 1333  Infusion Stop Time: 1415  Post Infusion IV Care: Peripheral IV Discontinued  Discharge: Condition: Good, Destination: Home . AVS Declined  Performed by:  Eleanor DELENA Bloch, RN

## 2024-08-29 NOTE — Telephone Encounter (Signed)
 Can you  please cancel his appointment 09/08/24. Patient feeling better and will reschedule if needed. I am aware of this

## 2024-08-31 ENCOUNTER — Encounter: Payer: Self-pay | Admitting: Internal Medicine

## 2024-08-31 ENCOUNTER — Other Ambulatory Visit: Payer: Self-pay | Admitting: Orthopedic Surgery

## 2024-08-31 DIAGNOSIS — R300 Dysuria: Secondary | ICD-10-CM | POA: Diagnosis not present

## 2024-08-31 DIAGNOSIS — R509 Fever, unspecified: Secondary | ICD-10-CM | POA: Diagnosis not present

## 2024-08-31 DIAGNOSIS — N401 Enlarged prostate with lower urinary tract symptoms: Secondary | ICD-10-CM | POA: Diagnosis not present

## 2024-08-31 DIAGNOSIS — M25562 Pain in left knee: Secondary | ICD-10-CM

## 2024-09-01 ENCOUNTER — Telehealth: Payer: Self-pay | Admitting: Internal Medicine

## 2024-09-01 NOTE — Telephone Encounter (Addendum)
 Called pt yesterday: Recommended going to ED if has fever/chills. Pt states his UTI symptoms come and go, urine cx  on 9/9 NG.  if complicated uti, no po options based on past cx. Referred to [facility] for second opinion, he has not heard from them. Looks like referral was added to [facility] on 08/26/24.SABRA

## 2024-09-04 ENCOUNTER — Ambulatory Visit
Admission: RE | Admit: 2024-09-04 | Discharge: 2024-09-04 | Disposition: A | Discharge status: Home / Self Care | Source: Ambulatory Visit | Attending: Orthopedic Surgery | Admitting: Orthopedic Surgery

## 2024-09-04 DIAGNOSIS — M25562 Pain in left knee: Secondary | ICD-10-CM

## 2024-09-05 DIAGNOSIS — N390 Urinary tract infection, site not specified: Secondary | ICD-10-CM | POA: Diagnosis not present

## 2024-09-06 DIAGNOSIS — N390 Urinary tract infection, site not specified: Secondary | ICD-10-CM | POA: Diagnosis not present

## 2024-09-07 DIAGNOSIS — B9620 Unspecified Escherichia coli [E. coli] as the cause of diseases classified elsewhere: Secondary | ICD-10-CM | POA: Diagnosis not present

## 2024-09-07 DIAGNOSIS — N390 Urinary tract infection, site not specified: Secondary | ICD-10-CM | POA: Diagnosis not present

## 2024-09-07 DIAGNOSIS — Z7901 Long term (current) use of anticoagulants: Secondary | ICD-10-CM | POA: Diagnosis not present

## 2024-09-07 DIAGNOSIS — K5792 Diverticulitis of intestine, part unspecified, without perforation or abscess without bleeding: Secondary | ICD-10-CM | POA: Diagnosis not present

## 2024-09-07 DIAGNOSIS — I4891 Unspecified atrial fibrillation: Secondary | ICD-10-CM | POA: Diagnosis not present

## 2024-09-07 DIAGNOSIS — Z792 Long term (current) use of antibiotics: Secondary | ICD-10-CM | POA: Diagnosis not present

## 2024-09-07 DIAGNOSIS — M109 Gout, unspecified: Secondary | ICD-10-CM | POA: Diagnosis not present

## 2024-09-07 DIAGNOSIS — J329 Chronic sinusitis, unspecified: Secondary | ICD-10-CM | POA: Diagnosis not present

## 2024-09-07 DIAGNOSIS — M542 Cervicalgia: Secondary | ICD-10-CM | POA: Diagnosis not present

## 2024-09-07 DIAGNOSIS — E785 Hyperlipidemia, unspecified: Secondary | ICD-10-CM | POA: Diagnosis not present

## 2024-09-07 DIAGNOSIS — Z1612 Extended spectrum beta lactamase (ESBL) resistance: Secondary | ICD-10-CM | POA: Diagnosis not present

## 2024-09-07 DIAGNOSIS — Z452 Encounter for adjustment and management of vascular access device: Secondary | ICD-10-CM | POA: Diagnosis not present

## 2024-09-08 ENCOUNTER — Ambulatory Visit: Admitting: Cardiology

## 2024-09-08 DIAGNOSIS — N320 Bladder-neck obstruction: Secondary | ICD-10-CM | POA: Diagnosis not present

## 2024-09-08 DIAGNOSIS — N390 Urinary tract infection, site not specified: Secondary | ICD-10-CM | POA: Diagnosis not present

## 2024-09-08 DIAGNOSIS — R972 Elevated prostate specific antigen [PSA]: Secondary | ICD-10-CM | POA: Diagnosis not present

## 2024-09-08 DIAGNOSIS — R310 Gross hematuria: Secondary | ICD-10-CM | POA: Diagnosis not present

## 2024-09-09 DIAGNOSIS — I8002 Phlebitis and thrombophlebitis of superficial vessels of left lower extremity: Secondary | ICD-10-CM | POA: Diagnosis not present

## 2024-09-12 DIAGNOSIS — G4733 Obstructive sleep apnea (adult) (pediatric): Secondary | ICD-10-CM | POA: Diagnosis not present

## 2024-09-12 DIAGNOSIS — M109 Gout, unspecified: Secondary | ICD-10-CM | POA: Diagnosis not present

## 2024-09-12 DIAGNOSIS — Z792 Long term (current) use of antibiotics: Secondary | ICD-10-CM | POA: Diagnosis not present

## 2024-09-12 DIAGNOSIS — Z1612 Extended spectrum beta lactamase (ESBL) resistance: Secondary | ICD-10-CM | POA: Diagnosis not present

## 2024-09-12 DIAGNOSIS — K5792 Diverticulitis of intestine, part unspecified, without perforation or abscess without bleeding: Secondary | ICD-10-CM | POA: Diagnosis not present

## 2024-09-12 DIAGNOSIS — N390 Urinary tract infection, site not specified: Secondary | ICD-10-CM | POA: Diagnosis not present

## 2024-09-12 DIAGNOSIS — J329 Chronic sinusitis, unspecified: Secondary | ICD-10-CM | POA: Diagnosis not present

## 2024-09-12 DIAGNOSIS — M542 Cervicalgia: Secondary | ICD-10-CM | POA: Diagnosis not present

## 2024-09-12 DIAGNOSIS — Z452 Encounter for adjustment and management of vascular access device: Secondary | ICD-10-CM | POA: Diagnosis not present

## 2024-09-12 DIAGNOSIS — E785 Hyperlipidemia, unspecified: Secondary | ICD-10-CM | POA: Diagnosis not present

## 2024-09-12 DIAGNOSIS — B9620 Unspecified Escherichia coli [E. coli] as the cause of diseases classified elsewhere: Secondary | ICD-10-CM | POA: Diagnosis not present

## 2024-09-12 DIAGNOSIS — Z7901 Long term (current) use of anticoagulants: Secondary | ICD-10-CM | POA: Diagnosis not present

## 2024-09-12 DIAGNOSIS — I4891 Unspecified atrial fibrillation: Secondary | ICD-10-CM | POA: Diagnosis not present

## 2024-09-14 DIAGNOSIS — N390 Urinary tract infection, site not specified: Secondary | ICD-10-CM | POA: Diagnosis not present

## 2024-09-19 ENCOUNTER — Encounter: Payer: Self-pay | Admitting: Internal Medicine

## 2024-09-19 DIAGNOSIS — N390 Urinary tract infection, site not specified: Secondary | ICD-10-CM | POA: Diagnosis not present

## 2024-09-19 DIAGNOSIS — M542 Cervicalgia: Secondary | ICD-10-CM | POA: Diagnosis not present

## 2024-09-19 DIAGNOSIS — B9620 Unspecified Escherichia coli [E. coli] as the cause of diseases classified elsewhere: Secondary | ICD-10-CM | POA: Diagnosis not present

## 2024-09-19 DIAGNOSIS — M109 Gout, unspecified: Secondary | ICD-10-CM | POA: Diagnosis not present

## 2024-09-19 DIAGNOSIS — I4891 Unspecified atrial fibrillation: Secondary | ICD-10-CM | POA: Diagnosis not present

## 2024-09-19 DIAGNOSIS — Z792 Long term (current) use of antibiotics: Secondary | ICD-10-CM | POA: Diagnosis not present

## 2024-09-19 DIAGNOSIS — Z7901 Long term (current) use of anticoagulants: Secondary | ICD-10-CM | POA: Diagnosis not present

## 2024-09-19 DIAGNOSIS — E785 Hyperlipidemia, unspecified: Secondary | ICD-10-CM | POA: Diagnosis not present

## 2024-09-19 DIAGNOSIS — Z452 Encounter for adjustment and management of vascular access device: Secondary | ICD-10-CM | POA: Diagnosis not present

## 2024-09-19 DIAGNOSIS — K5792 Diverticulitis of intestine, part unspecified, without perforation or abscess without bleeding: Secondary | ICD-10-CM | POA: Diagnosis not present

## 2024-09-19 DIAGNOSIS — J329 Chronic sinusitis, unspecified: Secondary | ICD-10-CM | POA: Diagnosis not present

## 2024-09-19 DIAGNOSIS — Z1612 Extended spectrum beta lactamase (ESBL) resistance: Secondary | ICD-10-CM | POA: Diagnosis not present

## 2024-09-21 DIAGNOSIS — N390 Urinary tract infection, site not specified: Secondary | ICD-10-CM | POA: Diagnosis not present

## 2024-09-27 ENCOUNTER — Encounter: Payer: Self-pay | Admitting: Internal Medicine

## 2024-09-27 DIAGNOSIS — Z792 Long term (current) use of antibiotics: Secondary | ICD-10-CM | POA: Diagnosis not present

## 2024-09-27 DIAGNOSIS — Z7901 Long term (current) use of anticoagulants: Secondary | ICD-10-CM | POA: Diagnosis not present

## 2024-09-27 DIAGNOSIS — K5792 Diverticulitis of intestine, part unspecified, without perforation or abscess without bleeding: Secondary | ICD-10-CM | POA: Diagnosis not present

## 2024-09-27 DIAGNOSIS — M542 Cervicalgia: Secondary | ICD-10-CM | POA: Diagnosis not present

## 2024-09-27 DIAGNOSIS — Z452 Encounter for adjustment and management of vascular access device: Secondary | ICD-10-CM | POA: Diagnosis not present

## 2024-09-27 DIAGNOSIS — B9620 Unspecified Escherichia coli [E. coli] as the cause of diseases classified elsewhere: Secondary | ICD-10-CM | POA: Diagnosis not present

## 2024-09-27 DIAGNOSIS — J329 Chronic sinusitis, unspecified: Secondary | ICD-10-CM | POA: Diagnosis not present

## 2024-09-27 DIAGNOSIS — N390 Urinary tract infection, site not specified: Secondary | ICD-10-CM | POA: Diagnosis not present

## 2024-09-27 DIAGNOSIS — E785 Hyperlipidemia, unspecified: Secondary | ICD-10-CM | POA: Diagnosis not present

## 2024-09-27 DIAGNOSIS — Z1612 Extended spectrum beta lactamase (ESBL) resistance: Secondary | ICD-10-CM | POA: Diagnosis not present

## 2024-09-27 DIAGNOSIS — I4891 Unspecified atrial fibrillation: Secondary | ICD-10-CM | POA: Diagnosis not present

## 2024-09-27 DIAGNOSIS — M109 Gout, unspecified: Secondary | ICD-10-CM | POA: Diagnosis not present

## 2024-09-28 DIAGNOSIS — N390 Urinary tract infection, site not specified: Secondary | ICD-10-CM | POA: Diagnosis not present

## 2024-09-29 NOTE — Telephone Encounter (Signed)
 Please make referral as requested; UC patient moving to [facility] area

## 2024-09-30 DIAGNOSIS — N390 Urinary tract infection, site not specified: Secondary | ICD-10-CM | POA: Diagnosis not present

## 2024-10-03 DIAGNOSIS — Z1612 Extended spectrum beta lactamase (ESBL) resistance: Secondary | ICD-10-CM | POA: Diagnosis not present

## 2024-10-03 DIAGNOSIS — K5792 Diverticulitis of intestine, part unspecified, without perforation or abscess without bleeding: Secondary | ICD-10-CM | POA: Diagnosis not present

## 2024-10-03 DIAGNOSIS — N390 Urinary tract infection, site not specified: Secondary | ICD-10-CM | POA: Diagnosis not present

## 2024-10-03 DIAGNOSIS — E785 Hyperlipidemia, unspecified: Secondary | ICD-10-CM | POA: Diagnosis not present

## 2024-10-03 DIAGNOSIS — Z7901 Long term (current) use of anticoagulants: Secondary | ICD-10-CM | POA: Diagnosis not present

## 2024-10-03 DIAGNOSIS — B9620 Unspecified Escherichia coli [E. coli] as the cause of diseases classified elsewhere: Secondary | ICD-10-CM | POA: Diagnosis not present

## 2024-10-03 DIAGNOSIS — Z452 Encounter for adjustment and management of vascular access device: Secondary | ICD-10-CM | POA: Diagnosis not present

## 2024-10-03 DIAGNOSIS — J329 Chronic sinusitis, unspecified: Secondary | ICD-10-CM | POA: Diagnosis not present

## 2024-10-03 DIAGNOSIS — I4891 Unspecified atrial fibrillation: Secondary | ICD-10-CM | POA: Diagnosis not present

## 2024-10-03 DIAGNOSIS — M109 Gout, unspecified: Secondary | ICD-10-CM | POA: Diagnosis not present

## 2024-10-03 DIAGNOSIS — Z792 Long term (current) use of antibiotics: Secondary | ICD-10-CM | POA: Diagnosis not present

## 2024-10-03 DIAGNOSIS — M542 Cervicalgia: Secondary | ICD-10-CM | POA: Diagnosis not present

## 2024-10-05 DIAGNOSIS — N390 Urinary tract infection, site not specified: Secondary | ICD-10-CM | POA: Diagnosis not present

## 2024-10-07 DIAGNOSIS — N390 Urinary tract infection, site not specified: Secondary | ICD-10-CM | POA: Diagnosis not present

## 2024-10-07 DIAGNOSIS — J329 Chronic sinusitis, unspecified: Secondary | ICD-10-CM | POA: Diagnosis not present

## 2024-10-07 DIAGNOSIS — Z1612 Extended spectrum beta lactamase (ESBL) resistance: Secondary | ICD-10-CM | POA: Diagnosis not present

## 2024-10-07 DIAGNOSIS — M109 Gout, unspecified: Secondary | ICD-10-CM | POA: Diagnosis not present

## 2024-10-07 DIAGNOSIS — Z7901 Long term (current) use of anticoagulants: Secondary | ICD-10-CM | POA: Diagnosis not present

## 2024-10-07 DIAGNOSIS — E785 Hyperlipidemia, unspecified: Secondary | ICD-10-CM | POA: Diagnosis not present

## 2024-10-07 DIAGNOSIS — Z452 Encounter for adjustment and management of vascular access device: Secondary | ICD-10-CM | POA: Diagnosis not present

## 2024-10-07 DIAGNOSIS — B9620 Unspecified Escherichia coli [E. coli] as the cause of diseases classified elsewhere: Secondary | ICD-10-CM | POA: Diagnosis not present

## 2024-10-07 DIAGNOSIS — M542 Cervicalgia: Secondary | ICD-10-CM | POA: Diagnosis not present

## 2024-10-07 DIAGNOSIS — Z792 Long term (current) use of antibiotics: Secondary | ICD-10-CM | POA: Diagnosis not present

## 2024-10-07 DIAGNOSIS — I4891 Unspecified atrial fibrillation: Secondary | ICD-10-CM | POA: Diagnosis not present

## 2024-10-07 DIAGNOSIS — K5792 Diverticulitis of intestine, part unspecified, without perforation or abscess without bleeding: Secondary | ICD-10-CM | POA: Diagnosis not present

## 2024-10-10 DIAGNOSIS — E785 Hyperlipidemia, unspecified: Secondary | ICD-10-CM | POA: Diagnosis not present

## 2024-10-10 DIAGNOSIS — N390 Urinary tract infection, site not specified: Secondary | ICD-10-CM | POA: Diagnosis not present

## 2024-10-10 DIAGNOSIS — Z7901 Long term (current) use of anticoagulants: Secondary | ICD-10-CM | POA: Diagnosis not present

## 2024-10-10 DIAGNOSIS — M109 Gout, unspecified: Secondary | ICD-10-CM | POA: Diagnosis not present

## 2024-10-10 DIAGNOSIS — J329 Chronic sinusitis, unspecified: Secondary | ICD-10-CM | POA: Diagnosis not present

## 2024-10-10 DIAGNOSIS — K5792 Diverticulitis of intestine, part unspecified, without perforation or abscess without bleeding: Secondary | ICD-10-CM | POA: Diagnosis not present

## 2024-10-10 DIAGNOSIS — Z792 Long term (current) use of antibiotics: Secondary | ICD-10-CM | POA: Diagnosis not present

## 2024-10-10 DIAGNOSIS — Z452 Encounter for adjustment and management of vascular access device: Secondary | ICD-10-CM | POA: Diagnosis not present

## 2024-10-10 DIAGNOSIS — M542 Cervicalgia: Secondary | ICD-10-CM | POA: Diagnosis not present

## 2024-10-10 DIAGNOSIS — I4891 Unspecified atrial fibrillation: Secondary | ICD-10-CM | POA: Diagnosis not present

## 2024-10-10 DIAGNOSIS — B9620 Unspecified Escherichia coli [E. coli] as the cause of diseases classified elsewhere: Secondary | ICD-10-CM | POA: Diagnosis not present

## 2024-10-10 DIAGNOSIS — Z1612 Extended spectrum beta lactamase (ESBL) resistance: Secondary | ICD-10-CM | POA: Diagnosis not present

## 2024-10-12 DIAGNOSIS — N390 Urinary tract infection, site not specified: Secondary | ICD-10-CM | POA: Diagnosis not present

## 2024-10-18 DIAGNOSIS — Z792 Long term (current) use of antibiotics: Secondary | ICD-10-CM | POA: Diagnosis not present

## 2024-10-18 DIAGNOSIS — B9620 Unspecified Escherichia coli [E. coli] as the cause of diseases classified elsewhere: Secondary | ICD-10-CM | POA: Diagnosis not present

## 2024-10-18 DIAGNOSIS — N390 Urinary tract infection, site not specified: Secondary | ICD-10-CM | POA: Diagnosis not present

## 2024-10-18 DIAGNOSIS — E785 Hyperlipidemia, unspecified: Secondary | ICD-10-CM | POA: Diagnosis not present

## 2024-10-18 DIAGNOSIS — Z452 Encounter for adjustment and management of vascular access device: Secondary | ICD-10-CM | POA: Diagnosis not present

## 2024-10-18 DIAGNOSIS — K5792 Diverticulitis of intestine, part unspecified, without perforation or abscess without bleeding: Secondary | ICD-10-CM | POA: Diagnosis not present

## 2024-10-18 DIAGNOSIS — I4891 Unspecified atrial fibrillation: Secondary | ICD-10-CM | POA: Diagnosis not present

## 2024-10-18 DIAGNOSIS — Z7901 Long term (current) use of anticoagulants: Secondary | ICD-10-CM | POA: Diagnosis not present

## 2024-10-18 DIAGNOSIS — M109 Gout, unspecified: Secondary | ICD-10-CM | POA: Diagnosis not present

## 2024-10-18 DIAGNOSIS — M542 Cervicalgia: Secondary | ICD-10-CM | POA: Diagnosis not present

## 2024-10-18 DIAGNOSIS — Z1612 Extended spectrum beta lactamase (ESBL) resistance: Secondary | ICD-10-CM | POA: Diagnosis not present

## 2024-10-18 DIAGNOSIS — J329 Chronic sinusitis, unspecified: Secondary | ICD-10-CM | POA: Diagnosis not present

## 2024-10-24 ENCOUNTER — Encounter: Payer: Self-pay | Admitting: Internal Medicine

## 2024-10-24 ENCOUNTER — Ambulatory Visit (INDEPENDENT_AMBULATORY_CARE_PROVIDER_SITE_OTHER): Admitting: *Deleted

## 2024-10-24 VITALS — BP 130/56 | HR 64 | Temp 97.5°F | Resp 18 | Ht 69.0 in | Wt 149.2 lb

## 2024-10-24 DIAGNOSIS — K529 Noninfective gastroenteritis and colitis, unspecified: Secondary | ICD-10-CM | POA: Diagnosis not present

## 2024-10-24 MED ORDER — VEDOLIZUMAB 300 MG IV SOLR
300.0000 mg | Freq: Once | INTRAVENOUS | Status: AC
  Administered 2024-10-24: 300 mg via INTRAVENOUS
  Filled 2024-10-24: qty 5

## 2024-10-24 NOTE — Progress Notes (Signed)
 Diagnosis: Ulcerative Colitis  Provider:  Mannam, Praveen MD  Procedure: IV Infusion  IV Type: Peripheral, IV Location: L Antecubital  Entyvio  (Vedolizumab ), Dose: 300 mg  Infusion Start Time: 0904 am  Infusion Stop Time: 0937 am  Post Infusion IV Care: Observation period completed and Peripheral IV Discontinued  Discharge: Condition: Good, Destination: Home . AVS Declined  Performed by:  Trudy Lamarr LABOR, RN

## 2024-10-25 NOTE — Telephone Encounter (Signed)
 Ok to keep entyvio  going at current dose and infusion interval at [facility] in [place], [place] until he is established with his new GI MD Thanks JMP

## 2024-10-28 ENCOUNTER — Other Ambulatory Visit: Payer: Self-pay | Admitting: *Deleted

## 2024-10-28 NOTE — Telephone Encounter (Signed)
 Entyvio  300 mg every 8 week dosing order has been created to be sent to [facility] in [place], [place]. Attached will also be patient demographics, insurance card, medication list/allergies and pertinent medical records. This information has been placed on Dr Gordy Pyrtle's desk inbasket for review and signature before sending to [facility].

## 2024-10-31 DIAGNOSIS — Z1331 Encounter for screening for depression: Secondary | ICD-10-CM | POA: Diagnosis not present

## 2024-10-31 DIAGNOSIS — N390 Urinary tract infection, site not specified: Secondary | ICD-10-CM | POA: Diagnosis not present

## 2024-10-31 DIAGNOSIS — Z22358 Carrier of other enterobacterales: Secondary | ICD-10-CM | POA: Diagnosis not present

## 2024-11-03 DIAGNOSIS — R339 Retention of urine, unspecified: Secondary | ICD-10-CM | POA: Diagnosis not present

## 2024-11-03 DIAGNOSIS — M160 Bilateral primary osteoarthritis of hip: Secondary | ICD-10-CM | POA: Diagnosis not present

## 2024-11-03 DIAGNOSIS — N401 Enlarged prostate with lower urinary tract symptoms: Secondary | ICD-10-CM | POA: Diagnosis not present

## 2024-11-03 NOTE — Progress Notes (Signed)
 Salena Sor, MD                                          MRN: [id number]   11/03/2024   The [facility]I Quality Team Specialist reviewed this patient medical record for the purposes of chart review for care gap closure. The following were reviewed: chart review for care gap closure-kidney health evaluation for diabetes:eGFR  and uACR.    VBCI Quality Team

## 2024-11-09 ENCOUNTER — Telehealth: Payer: Self-pay | Admitting: Internal Medicine

## 2024-11-09 ENCOUNTER — Telehealth: Payer: Self-pay

## 2024-11-09 NOTE — Telephone Encounter (Signed)
 Inbound call from Tillman from [facility] stating we need to submit a prior authorization for Entyvio  through cover my meds. The key number is [id number]. Please advise.

## 2024-11-09 NOTE — Telephone Encounter (Signed)
 Pharmacy Patient Advocate Encounter  Received notification from [facility] [employer] that Prior Authorization for Entyvio  300MG  solution has been APPROVED from 11-09-2024 to 11-09-2025   PA #/Case ID/Reference #: [id number]

## 2024-11-09 NOTE — Telephone Encounter (Signed)
 Pharmacy Patient Advocate Encounter   Received notification from Pt Calls Messages that prior authorization for Entyvio  300MG  solution is required/requested.   Insurance verification completed.   The patient is insured through [employer].   Per test claim: PA required; PA submitted to above mentioned insurance via Latent Key/confirmation #/EOC [id number] Status is pending  *Please note that authorization form was already filled out and notes were already attached from [facility].

## 2024-11-18 DIAGNOSIS — M25552 Pain in left hip: Secondary | ICD-10-CM | POA: Diagnosis not present

## 2024-11-18 DIAGNOSIS — M160 Bilateral primary osteoarthritis of hip: Secondary | ICD-10-CM | POA: Diagnosis not present

## 2024-11-18 DIAGNOSIS — M25551 Pain in right hip: Secondary | ICD-10-CM | POA: Diagnosis not present

## 2024-11-21 DIAGNOSIS — N4289 Other specified disorders of prostate: Secondary | ICD-10-CM | POA: Diagnosis not present

## 2024-11-21 DIAGNOSIS — C801 Malignant (primary) neoplasm, unspecified: Secondary | ICD-10-CM | POA: Diagnosis not present

## 2024-11-21 DIAGNOSIS — N400 Enlarged prostate without lower urinary tract symptoms: Secondary | ICD-10-CM | POA: Diagnosis not present

## 2024-11-24 DIAGNOSIS — R972 Elevated prostate specific antigen [PSA]: Secondary | ICD-10-CM | POA: Diagnosis not present

## 2024-11-28 DIAGNOSIS — K7689 Other specified diseases of liver: Secondary | ICD-10-CM | POA: Diagnosis not present

## 2024-11-28 DIAGNOSIS — Z8719 Personal history of other diseases of the digestive system: Secondary | ICD-10-CM | POA: Diagnosis not present

## 2024-11-28 DIAGNOSIS — R1012 Left upper quadrant pain: Secondary | ICD-10-CM | POA: Diagnosis not present

## 2024-12-01 DIAGNOSIS — R1013 Epigastric pain: Secondary | ICD-10-CM | POA: Diagnosis not present

## 2024-12-01 DIAGNOSIS — D721 Eosinophilia, unspecified: Secondary | ICD-10-CM | POA: Diagnosis not present

## 2024-12-01 DIAGNOSIS — E559 Vitamin D deficiency, unspecified: Secondary | ICD-10-CM | POA: Diagnosis not present

## 2024-12-01 DIAGNOSIS — K529 Noninfective gastroenteritis and colitis, unspecified: Secondary | ICD-10-CM | POA: Diagnosis not present

## 2024-12-05 ENCOUNTER — Other Ambulatory Visit: Payer: Self-pay

## 2024-12-05 MED ORDER — HYOSCYAMINE SULFATE 0.125 MG SL SUBL: SUBLINGUAL_TABLET | SUBLINGUAL | 1 refills | Status: AC

## 2024-12-20 ENCOUNTER — Other Ambulatory Visit: Payer: Self-pay

## 2024-12-20 ENCOUNTER — Telehealth: Payer: Self-pay | Admitting: Pharmacy Technician

## 2024-12-20 NOTE — Telephone Encounter (Signed)
 Auth Submission: approved Sumbitted via phone due to Latent error with Latent, it did not send to plan. Called [employer] and was told no shara is needed, I have submitted/requested auth review anyway. Site of care: Site of care: [facility] Payer: [employer] MEDICARE Medication & CPT/J Code(s) submitted: Entyvio  (Vedolizumab ) J3380 Diagnosis Code: D51.50 Route of submission (phone, fax, portal): PHONE Phone #[phone] Fax #[phone] Auth type: Buy/Bill PB Units/visits requested: 300MG  Q8WKS Reference number: [id number] Approval from: 12/20/24 - 12/21/25

## 2024-12-21 ENCOUNTER — Encounter: Payer: Self-pay | Admitting: Internal Medicine

## 2024-12-21 ENCOUNTER — Telehealth: Payer: Self-pay | Admitting: Pharmacist

## 2024-12-21 DIAGNOSIS — K529 Noninfective gastroenteritis and colitis, unspecified: Secondary | ICD-10-CM

## 2024-12-21 NOTE — Telephone Encounter (Signed)
 Received request from [place] to send Entyvio  solution rx to [facility] however it appears that patient has transferred care from [facility] closer to [facility], [place].  Note from 10/28/24 Corey Naomie SAILOR, RN    10/28/24  1:43 PM Note Entyvio  300 mg every 8 week dosing order has been created to be sent to [facility] in [place], [place]. Attached will also be patient demographics, insurance card, medication list/allergies and pertinent medical records. This information has been placed on Dr Gordy Pyrtle's desk inbasket for review and signature before sending to [facility].      Sherry Pennant, PharmD, MPH, BCPS, CPP Clinical Pharmacist

## 2024-12-27 ENCOUNTER — Other Ambulatory Visit: Payer: Self-pay

## 2024-12-27 ENCOUNTER — Other Ambulatory Visit (HOSPITAL_COMMUNITY): Payer: Self-pay

## 2024-12-27 ENCOUNTER — Encounter: Payer: Self-pay | Admitting: Internal Medicine

## 2024-12-27 MED ORDER — ENTYVIO 300 MG IV SOLR
INTRAVENOUS | 0 refills | Status: DC
  Filled 2024-12-27: qty 1, 56d supply, fill #0

## 2024-12-27 MED ORDER — ENTYVIO 300 MG IV SOLR
INTRAVENOUS | 0 refills | Status: DC
  Filled 2024-12-27: qty 1, fill #0

## 2024-12-27 NOTE — Telephone Encounter (Signed)
 Rx discontinued. Patient will be buy-and-bill. Will use Pyxis supply at infusion center tomorrow  Sherry Pennant, PharmD, MPH, BCPS, CPP Clinical Pharmacist

## 2024-12-27 NOTE — Addendum Note (Signed)
 Addended by: DAYNE SHERRY RAMAN on: 12/27/2024 10:15 AM   Modules accepted: Orders

## 2024-12-27 NOTE — Telephone Encounter (Signed)
 Rx for Entyvio  vial 300mg  IV every 8 weeks sent to [facility] to be couriered to [facility]  Sherry Pennant, PharmD, MPH, BCPS, CPP Clinical Pharmacist

## 2024-12-28 ENCOUNTER — Ambulatory Visit (INDEPENDENT_AMBULATORY_CARE_PROVIDER_SITE_OTHER)

## 2024-12-28 VITALS — BP 168/75 | HR 62 | Temp 97.5°F | Resp 18 | Ht 69.0 in | Wt 143.6 lb

## 2024-12-28 DIAGNOSIS — K529 Noninfective gastroenteritis and colitis, unspecified: Secondary | ICD-10-CM | POA: Diagnosis not present

## 2024-12-28 MED ORDER — VEDOLIZUMAB 300 MG IV SOLR
300.0000 mg | Freq: Once | INTRAVENOUS | Status: AC
  Administered 2024-12-28: 300 mg via INTRAVENOUS
  Filled 2024-12-28: qty 5

## 2024-12-28 NOTE — Telephone Encounter (Signed)
 Patient confirmed that Entyvio  infusion today will be last infusion with our treatment site. Will close therapy plan once today's treatment is completed  Sherry Pennant, PharmD, MPH, BCPS, CPP Clinical Pharmacist

## 2024-12-28 NOTE — Progress Notes (Signed)
 Diagnosis: Ulcerative Colitis  Provider:  Praveen Mannam MD  Procedure: IV Infusion  IV Type: Peripheral, IV Location: R Forearm  Entyvio  (Vedolizumab ), Dose: 300 mg  Infusion Start Time: 0949  Infusion Stop Time: 1022  Post Infusion IV Care: Peripheral IV Discontinued  Discharge: Condition: Good, Destination: Home . AVS Declined  Performed by:  Daisie Haft, RN

## 2024-12-29 NOTE — Addendum Note (Signed)
 Addended by: DAYNE SHERRY RAMAN on: 12/29/2024 08:42 AM   Modules accepted: Orders
